# Patient Record
Sex: Male | Born: 1975 | Race: White | Hispanic: No | Marital: Married | Smoking: Never smoker
Health system: Southern US, Community
[De-identification: ages and names within clinical notes are randomized; demographics above are authoritative.]

## PROBLEM LIST (undated history)

## (undated) DIAGNOSIS — I10 Essential (primary) hypertension: Secondary | ICD-10-CM

## (undated) DIAGNOSIS — R112 Nausea with vomiting, unspecified: Secondary | ICD-10-CM

## (undated) DIAGNOSIS — S43439A Superior glenoid labrum lesion of unspecified shoulder, initial encounter: Secondary | ICD-10-CM

## (undated) DIAGNOSIS — S43432A Superior glenoid labrum lesion of left shoulder, initial encounter: Secondary | ICD-10-CM

## (undated) DIAGNOSIS — N189 Chronic kidney disease, unspecified: Secondary | ICD-10-CM

## (undated) DIAGNOSIS — Z9889 Other specified postprocedural states: Secondary | ICD-10-CM

## (undated) DIAGNOSIS — S43431A Superior glenoid labrum lesion of right shoulder, initial encounter: Secondary | ICD-10-CM

## (undated) HISTORY — PX: OTHER SURGICAL HISTORY: SHX169

## (undated) HISTORY — PX: SHOULDER SURGERY: SHX246

## (undated) HISTORY — PX: EYE SURGERY: SHX253

## (undated) HISTORY — PX: WISDOM TOOTH EXTRACTION: SHX21

## (undated) HISTORY — PX: VASECTOMY: SHX75

## (undated) HISTORY — PX: KNEE SURGERY: SHX244

---
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
## 2006-07-06 ENCOUNTER — Ambulatory Visit: Payer: Self-pay | Admitting: Sports Medicine

## 2008-10-19 ENCOUNTER — Encounter (INDEPENDENT_AMBULATORY_CARE_PROVIDER_SITE_OTHER): Payer: Self-pay | Admitting: Family Medicine

## 2008-10-19 DIAGNOSIS — J020 Streptococcal pharyngitis: Secondary | ICD-10-CM | POA: Insufficient documentation

## 2009-04-28 ENCOUNTER — Encounter: Admission: RE | Admit: 2009-04-28 | Discharge: 2009-04-28 | Payer: Self-pay | Admitting: Family Medicine

## 2009-05-26 ENCOUNTER — Ambulatory Visit: Payer: Self-pay | Admitting: Sports Medicine

## 2009-05-26 DIAGNOSIS — M67919 Unspecified disorder of synovium and tendon, unspecified shoulder: Secondary | ICD-10-CM | POA: Insufficient documentation

## 2009-05-26 DIAGNOSIS — M719 Bursopathy, unspecified: Secondary | ICD-10-CM

## 2010-06-07 NOTE — Assessment & Plan Note (Signed)
Summary: 4PM APPT,L SHOULDER PAIN X 2 WKS,MC   Vital Signs:  Patient profile:   35 year old male Height:      75 inches Weight:      200 pounds BMI:     25.09 BP sitting:   111 / 73  Vitals Entered By: Lillia Pauls CMA (May 26, 2009 4:13 PM)  History of Present Illness: - anterolateral left shoulder pain of insidious onset in 04/2009. - Carries baby with left arm in adduction and internal rotation with elbow in flexion. - Baby now weighs  ~25 lbs. - pain worst after carrying baby and on overhead motions.  - pain relieved by rest and position change. - No neck pain. No numbness/tingling/weakness. - No previous shoulder trauma or procedures. - question of left shoulder subluxation  ~10 yrs ago. Few recurrent episodes in the interim during sports activities and while sleeping in "funny" position behind head.  - Not taking any medications for shoulder pain.  Allergies (verified): No Known Drug Allergies PMH-FH-SH reviewed for relevance  Physical Exam  General:  no anterior shoulder instability  Msk:  NECK: FROM w/o pain.  SHOULDERS: Full ROM/strength. Mildly (+) NEERS toward end flexion. (-) ttp/Hawkins/Empty can. Normal labral stability.  ELBOWS/WRISTS/HANDS: Full ROM/strength. Pulses:  2+ radial pulses. Neurologic:  (-) spurling's bilaterally. 2+ DTR throughout the arms. Additional Exam:  Musculoskeletal Ultrasound: Longitudinal and transverse views of the left shoulder revealed the following:  Biceps Tendon: Normal. AC Joint: Normal. Subscapularis: (+) impingement wrt coracoid. Increased bursal fluid. Supraspinatus: (-) impingement. Increased bursal fluid. Infraspinatus: Normal. Teres Minor: Normal. Deltoid: Normal. Humeral Head: Normal. Labrum: Normal    Impression & Recommendations:  Problem # 1:  UNSPEC DISORDERS BURSAE&TENDONS SHOULDER REGION (ICD-726.10) RC Bursitis  - Daily rotator cuff and scapular stabilization exercises, per provided  patient handout, using blue band as tolerated. - OTC naprosyn 440 mg by mouth two times a day with food for 7 - 10 days. - Avoid prolonged carrying of baby in left arm as baby has naturally gained weight. - RTC as needed for any concerns.  Orders: US EXTREMITY NON-VASC REAL-TIME IMG [phone])

## 2011-01-03 ENCOUNTER — Other Ambulatory Visit: Payer: Self-pay | Admitting: Family Medicine

## 2011-01-03 DIAGNOSIS — R222 Localized swelling, mass and lump, trunk: Secondary | ICD-10-CM

## 2011-01-06 ENCOUNTER — Ambulatory Visit
Admission: RE | Admit: 2011-01-06 | Discharge: 2011-01-06 | Disposition: A | Discharge status: Home / Self Care | Source: Ambulatory Visit | Attending: Family Medicine | Admitting: Family Medicine

## 2011-01-06 DIAGNOSIS — R222 Localized swelling, mass and lump, trunk: Secondary | ICD-10-CM

## 2011-01-06 MED ORDER — IOHEXOL 300 MG/ML  SOLN
75.0000 mL | Freq: Once | INTRAMUSCULAR | Status: AC | PRN
  Administered 2011-01-06: 75 mL via INTRAVENOUS

## 2012-05-08 HISTORY — PX: SHOULDER SURGERY: SHX246

## 2013-01-23 ENCOUNTER — Other Ambulatory Visit: Payer: Self-pay | Admitting: Orthopedic Surgery

## 2013-01-23 DIAGNOSIS — M25511 Pain in right shoulder: Secondary | ICD-10-CM

## 2013-02-03 ENCOUNTER — Ambulatory Visit
Admission: RE | Admit: 2013-02-03 | Discharge: 2013-02-03 | Disposition: A | Discharge status: Home / Self Care | Source: Ambulatory Visit | Attending: Orthopedic Surgery | Admitting: Orthopedic Surgery

## 2013-02-03 DIAGNOSIS — M25511 Pain in right shoulder: Secondary | ICD-10-CM

## 2013-02-03 MED ORDER — IOHEXOL 180 MG/ML  SOLN
15.0000 mL | Freq: Once | INTRAMUSCULAR | Status: AC | PRN
  Administered 2013-02-03: 12 mL via INTRA_ARTICULAR

## 2013-02-11 ENCOUNTER — Encounter: Payer: Self-pay | Admitting: Sports Medicine

## 2013-02-11 ENCOUNTER — Ambulatory Visit (INDEPENDENT_AMBULATORY_CARE_PROVIDER_SITE_OTHER): Admitting: Sports Medicine

## 2013-02-11 VITALS — BP 137/86 | HR 53 | Ht 75.0 in | Wt 210.0 lb

## 2013-02-11 DIAGNOSIS — S43429A Sprain of unspecified rotator cuff capsule, initial encounter: Secondary | ICD-10-CM | POA: Insufficient documentation

## 2013-02-11 DIAGNOSIS — S43421A Sprain of right rotator cuff capsule, initial encounter: Secondary | ICD-10-CM

## 2013-02-11 DIAGNOSIS — M722 Plantar fascial fibromatosis: Secondary | ICD-10-CM | POA: Insufficient documentation

## 2013-02-11 MED ORDER — NITROGLYCERIN 0.2 MG/HR TD PT24: MEDICATED_PATCH | TRANSDERMAL | Status: DC

## 2013-02-11 NOTE — Progress Notes (Signed)
Patient ID: Elijah Johnson, male   DOB: 09-17-1975, 37 y.o.   MRN: [id number]  Chief Complaint  Patient presents with  . Foot Pain   HPI Elijah Johnson is a 37 y.o. Male who presents to the sports medicine clinic for evaluation of right heel pain and right shoulder pain.   - right heel: started in March after he started running and exercising after period of rest. Pain is located on the medial aspect of the bottom of his right foot close to the heel. Pain is worst with prolonged standing. Worst with initial standing in the morning. Pain is a burning, aching sensation. He used superfeet inserts in July which mildly helped. He also had a cortisone injection 6 weeks ago which helped.  He went hiking in [place] this weekend and had worst pain on the first day of hiking but improved on the second.   - right shoulder: he has a history of posterior labral repair of the left shoulder but has not had trouble with the right.  6 months ago, he threw a ball in a downward motion which instantly caused sudden pain along the shoulder. Pain has become worst with throwing or pushing overhead. No weakness. No numbness, no tingling  Medical and surgical history significant for left labral tear repair Note this also presented as a subscap type RC injury  Family History: non contributory  Social History History  Substance Use Topics  . Smoking status: Never Smoker   . Smokeless tobacco: Never Used  . Alcohol Use: Not on file    No Known Allergies  Current Outpatient Prescriptions  Medication Sig Dispense Refill  . meloxicam (MOBIC) 15 MG tablet Take 15 mg by mouth daily as needed for pain.      . nitroGLYCERIN (NITRODUR - DOSED IN MG/24 HR) 0.2 mg/hr patch Apply 1/4 patch to affected area daily.  Change patch every 24 hours.  30 patch  1   No current facility-administered medications for this visit.    Review of Systems Review of Systems Negative except per HPI Blood pressure 137/86, pulse 53,  height 6\' 3"  (1.905 m), weight 210 lb (95.255 kg).  Physical Exam Physical Exam Filed Vitals:   02/11/13 1334  Height: 6\' 3"  (1.905 m)  Weight: 210 lb (95.255 kg)   Foot: right: high arch but right navicular drop test positive. Dropped transverse arch Morton's toe, splaying between the 2nd and third toes  Callus along 3rd and 4th metatarsal pads.    Unstable ankle compared to left Tenderness to palpation along medial plantar calcaneal region on right  Right Shoulder:  Normal range of motion above head with painful drop arc around 100degrees No soft tissue swelling No tenderness along shoulder  Normal external and internal rotation bilaterally Normal empty can bilaterally Non conclusive O'Brien's Pain and weakness with subscapularis lift off test on right compared to left Pain also with abduction to 90deg and resisted IR Normal Hawkin's   Musculoskeletal Ultrasound: Right foot:  Thickened plantar fascia at 0.85cm    Right Shoulder:  Small hypoechoic area at level of subscapularis.  This seems to gap open with motion on [place]. Infraspinatus, supraspinatus and teres minor intact BT looks normal  Assessment    37 yo male who presents with right foot pain and right shoulder pain.  Right foot pain from plantar fasciitis likely prompted by sudden activity level after more prolonged period of rest.  Right shoulder pain from subscapularis tear based on ultrasound and physical exam  findings.     Plan    1. Plantar fasciitis of right foot:  - stretching exercises, plantar fasciitis exercises as well as cone touch balance exercise for ankle stability, given increased laxity in right ankle compared to left  2. Right shoulder pain: - internal rotation exercises with 5lb dumbbells.  - nitroglycerin protocol - follow up in 6 weeks     Marena Chancy 02/11/2013, 2:43 PM Family Medicine Resident, PGY-3

## 2013-02-11 NOTE — Patient Instructions (Addendum)
For the shoulder: it looks like a small tear subscapularis tendon.  Do the strengthening exercises with 5lb dumbmells, doing internal rotation at different levels.   For the plantar fascitis, do the sets of exercises and stretches.    Nitroglycerin Protocol   Apply 1/4 nitroglycerin patch to affected area daily.  Change position of patch within the affected area every 24 hours.  You may experience a headache during the first 1-2 weeks of using the patch, these should subside.  If you experience headaches after beginning nitroglycerin patch treatment, you may take your preferred over the counter pain reliever.  Another side effect of the nitroglycerin patch is skin irritation or rash related to patch adhesive.  Please notify our office if you develop more severe headaches or rash, and stop the patch.  Tendon healing with nitroglycerin patch may require 12 to 24 weeks depending on the extent of injury.  Men should not use if taking Viagra, Cialis, or Levitra.   Do not use if you have migraines or rosacea.   Please follow up in 6 weeks.

## 2013-02-11 NOTE — Assessment & Plan Note (Signed)
Stretching protocol  Strength protocol for PF and for ankle stability  Arch strap  Use arch support  Reck 8 wks

## 2013-02-11 NOTE — Assessment & Plan Note (Addendum)
Cannot exclude labral injury w equivocal test  Clearly some injury to RC at subscap tendon Seems likely small tear on [place] Labral injury eval if not resolving  Trial NTG protocol  Reck 6 wks

## 2013-03-25 ENCOUNTER — Ambulatory Visit: Admitting: Sports Medicine

## 2013-04-01 ENCOUNTER — Ambulatory Visit (INDEPENDENT_AMBULATORY_CARE_PROVIDER_SITE_OTHER): Admitting: Sports Medicine

## 2013-04-01 VITALS — BP 122/88 | HR 53 | Ht 75.0 in | Wt 210.0 lb

## 2013-04-01 DIAGNOSIS — S43421A Sprain of right rotator cuff capsule, initial encounter: Secondary | ICD-10-CM

## 2013-04-01 DIAGNOSIS — S43429A Sprain of unspecified rotator cuff capsule, initial encounter: Secondary | ICD-10-CM

## 2013-04-01 DIAGNOSIS — M722 Plantar fascial fibromatosis: Secondary | ICD-10-CM

## 2013-04-01 NOTE — Progress Notes (Signed)
Patient ID: Elijah Johnson, male   DOB: 05-08-1976, 37 y.o.   MRN: [id number]  Chief Complaint  Patient presents with  . Foot Pain   HPI Elijah Johnson is a 37 y.o. Male who presents to the sports medicine clinic for re-evaluation of right plantar fasciitis.  - Right heel: starting 6 months ago he was running and exercising after period of rest following a shoulder surgery. With running he developed pain located on the medial aspect of the right calcaneous only the plantar fascia. Pain is worst with prolonged standing. Worst with initial standing in the morning. Pain is a burning, aching sensation. He used superfeet inserts in July which mildly helped. He has been working on his stretching protocol and wearing a night splint that is helpful. He reports about 50% improvement back to baseline.   Concerning is right shoulder pain he did not start any exercises or the nitroglycerin protocol because he had an MRA two days after is appointment that showed a posterior labral tear and thus started planning for surgical repair with is orthopedic surgeon.  Medical and surgical history significant for left labral tear repair  Family History: no history of DM  Social History History  Substance Use Topics  . Smoking status: Never Smoker   . Smokeless tobacco: Never Used  . Alcohol Use: Not on file   No Known Allergies  Review of Systems Review of Systems Negative except per HPI Blood pressure 137/86, pulse 53, height 6\' 3"  (1.905 m), weight 210 lb (95.255 kg).  Physical Exam Physical Exam Filed Vitals:   02/11/13 1334  Height: 6\' 3"  (1.905 m)  Weight: 210 lb (95.255 kg)   Foot: right: high arch with right navicular drop test positive with standing. Dropped transverse arch Morton's toe, splaying between the 2nd and third toes  Unstable ankle joint with lateral joint laxity Tenderness to palpation along medial plantar calcaneal region on right with less pain.   Assessment 1. Right foot  pain from plantar fasciitis  2. Right shoulder with subscapularis tear based on ultrasound but this looks more like tendinopathy on MRI and MRI evidence of posterior labral tear   Plan 1. Plantar fasciitis of right foot:  - Continue aggressive stretching exercises with eccentric ankle exercises/ stability training, plantar fasciitis stretching, night splint, continue super feet insoles as they seem to be helpful. Recommend starting some light running and walking over the next few weeks until he has to stop for his shoulder surgery. Recommend cross training with biking during is shoulder surgery recovery period until he is cleared to start running again.  2. Right shoulder pain: plan for surgery in the next month with ortho for posterior labral tear.

## 2013-04-01 NOTE — Assessment & Plan Note (Signed)
Review of MRI does clearly show the tear in post half of labrum or RT shoulder (similar to one he had on left)  RC tendons look intact on MRI but some tendinopathy of subscap and SS Start RC rehab after his labral repair  Note we discussed trying to work with Amado Coe, PT as this would be convenient for him

## 2013-04-01 NOTE — Assessment & Plan Note (Signed)
Good improvement primarily with stretching protocol Arch strap Super feet  Cont these  Add more of heel raises  Reck if not better in 2 to 3 mos

## 2013-04-16 ENCOUNTER — Encounter (HOSPITAL_BASED_OUTPATIENT_CLINIC_OR_DEPARTMENT_OTHER): Payer: Self-pay | Admitting: *Deleted

## 2013-04-25 ENCOUNTER — Ambulatory Visit (HOSPITAL_BASED_OUTPATIENT_CLINIC_OR_DEPARTMENT_OTHER)
Admission: RE | Admit: 2013-04-25 | Discharge: 2013-04-25 | Disposition: A | Discharge status: Home / Self Care | Source: Ambulatory Visit | Attending: Orthopedic Surgery | Admitting: Orthopedic Surgery

## 2013-04-25 ENCOUNTER — Ambulatory Visit (HOSPITAL_BASED_OUTPATIENT_CLINIC_OR_DEPARTMENT_OTHER): Admitting: Anesthesiology

## 2013-04-25 ENCOUNTER — Encounter (HOSPITAL_BASED_OUTPATIENT_CLINIC_OR_DEPARTMENT_OTHER)
Admission: RE | Disposition: A | Discharge status: Home / Self Care | Payer: Self-pay | Source: Ambulatory Visit | Attending: Orthopedic Surgery

## 2013-04-25 ENCOUNTER — Encounter (HOSPITAL_BASED_OUTPATIENT_CLINIC_OR_DEPARTMENT_OTHER): Admitting: Anesthesiology

## 2013-04-25 ENCOUNTER — Encounter (HOSPITAL_BASED_OUTPATIENT_CLINIC_OR_DEPARTMENT_OTHER): Payer: Self-pay | Admitting: *Deleted

## 2013-04-25 DIAGNOSIS — M24819 Other specific joint derangements of unspecified shoulder, not elsewhere classified: Secondary | ICD-10-CM | POA: Insufficient documentation

## 2013-04-25 DIAGNOSIS — S43432A Superior glenoid labrum lesion of left shoulder, initial encounter: Secondary | ICD-10-CM

## 2013-04-25 DIAGNOSIS — S43431A Superior glenoid labrum lesion of right shoulder, initial encounter: Secondary | ICD-10-CM

## 2013-04-25 HISTORY — DX: Superior glenoid labrum lesion of left shoulder, initial encounter: S43.432A

## 2013-04-25 HISTORY — DX: Superior glenoid labrum lesion of right shoulder, initial encounter: S43.431A

## 2013-04-25 HISTORY — DX: Superior glenoid labrum lesion of unspecified shoulder, initial encounter: S43.439A

## 2013-04-25 SURGERY — SHOULDER ATHROSCOPY WITH CAPSULORRHAPHY
Anesthesia: General | Site: Shoulder | Laterality: Right

## 2013-04-25 MED ORDER — SENNA-DOCUSATE SODIUM 8.6-50 MG PO TABS: 2.0000 | ORAL_TABLET | Freq: Every day | ORAL | Status: DC

## 2013-04-25 MED ORDER — CEFAZOLIN SODIUM-DEXTROSE 2-3 GM-% IV SOLR
2.0000 g | INTRAVENOUS | Status: AC
  Administered 2013-04-25: 2 g via INTRAVENOUS

## 2013-04-25 MED ORDER — PROMETHAZINE HCL 25 MG PO TABS: 25.0000 mg | ORAL_TABLET | Freq: Four times a day (QID) | ORAL | Status: DC | PRN

## 2013-04-25 MED ORDER — SODIUM CHLORIDE 0.9 % IV SOLN
INTRAVENOUS | Status: DC | PRN
  Administered 2013-04-25: 1000 mL

## 2013-04-25 MED ORDER — SUCCINYLCHOLINE CHLORIDE 20 MG/ML IJ SOLN
INTRAMUSCULAR | Status: DC | PRN
  Administered 2013-04-25: 100 mg via INTRAVENOUS

## 2013-04-25 MED ORDER — SODIUM CHLORIDE 0.9 % IR SOLN
Status: DC | PRN
  Administered 2013-04-25: 3000 mL

## 2013-04-25 MED ORDER — METHOCARBAMOL 500 MG PO TABS: 500.0000 mg | ORAL_TABLET | Freq: Four times a day (QID) | ORAL | Status: AC

## 2013-04-25 MED ORDER — MIDAZOLAM HCL 2 MG/2ML IJ SOLN
1.0000 mg | INTRAMUSCULAR | Status: DC | PRN
  Administered 2013-04-25: 2 mg via INTRAVENOUS

## 2013-04-25 MED ORDER — FENTANYL CITRATE 0.05 MG/ML IJ SOLN
INTRAMUSCULAR | Status: DC | PRN
  Administered 2013-04-25: 50 ug via INTRAVENOUS

## 2013-04-25 MED ORDER — FENTANYL CITRATE 0.05 MG/ML IJ SOLN
INTRAMUSCULAR | Status: AC
  Filled 2013-04-25: qty 2

## 2013-04-25 MED ORDER — FENTANYL CITRATE 0.05 MG/ML IJ SOLN
50.0000 ug | INTRAMUSCULAR | Status: DC | PRN
  Administered 2013-04-25: 100 ug via INTRAVENOUS

## 2013-04-25 MED ORDER — LIDOCAINE HCL (CARDIAC) 20 MG/ML IV SOLN
INTRAVENOUS | Status: DC | PRN
  Administered 2013-04-25: 50 mg via INTRAVENOUS

## 2013-04-25 MED ORDER — PROPOFOL 10 MG/ML IV BOLUS
INTRAVENOUS | Status: DC | PRN
  Administered 2013-04-25: 200 mg via INTRAVENOUS

## 2013-04-25 MED ORDER — OXYCODONE HCL 5 MG PO TABS: 5.0000 mg | ORAL_TABLET | Freq: Once | ORAL | Status: DC | PRN

## 2013-04-25 MED ORDER — PROMETHAZINE HCL 25 MG/ML IJ SOLN: 6.2500 mg | INTRAMUSCULAR | Status: DC | PRN

## 2013-04-25 MED ORDER — ONDANSETRON HCL 4 MG/2ML IJ SOLN
INTRAMUSCULAR | Status: DC | PRN
  Administered 2013-04-25: 4 mg via INTRAVENOUS

## 2013-04-25 MED ORDER — OXYCODONE HCL 5 MG/5ML PO SOLN: 5.0000 mg | Freq: Once | ORAL | Status: DC | PRN

## 2013-04-25 MED ORDER — DEXAMETHASONE SODIUM PHOSPHATE 4 MG/ML IJ SOLN
INTRAMUSCULAR | Status: DC | PRN
  Administered 2013-04-25: 10 mg via INTRAVENOUS

## 2013-04-25 MED ORDER — CEFAZOLIN SODIUM-DEXTROSE 2-3 GM-% IV SOLR
INTRAVENOUS | Status: AC
  Filled 2013-04-25: qty 50

## 2013-04-25 MED ORDER — HYDROMORPHONE HCL PF 1 MG/ML IJ SOLN: 0.2500 mg | INTRAMUSCULAR | Status: DC | PRN

## 2013-04-25 MED ORDER — OXYCODONE-ACETAMINOPHEN 5-325 MG PO TABS: 1.0000 | ORAL_TABLET | Freq: Four times a day (QID) | ORAL | Status: DC | PRN

## 2013-04-25 MED ORDER — MIDAZOLAM HCL 2 MG/2ML IJ SOLN
INTRAMUSCULAR | Status: AC
  Filled 2013-04-25: qty 2

## 2013-04-25 MED ORDER — LACTATED RINGERS IV SOLN
INTRAVENOUS | Status: DC
  Administered 2013-04-25: 07:00:00 via INTRAVENOUS

## 2013-04-25 SURGICAL SUPPLY — 71 items
ANCH SUT SHRT 12.5 CANN EYLT (Anchor) ×1 IMPLANT
ANCHOR SUT BIOCOMP LK 2.9X12.5 (Anchor) ×1 IMPLANT
APL SKNCLS STERI-STRIP NONHPOA (GAUZE/BANDAGES/DRESSINGS) ×1
BENZOIN TINCTURE PRP APPL 2/3 (GAUZE/BANDAGES/DRESSINGS) ×2 IMPLANT
BLADE CUTTER GATOR 3.5 (BLADE) ×2 IMPLANT
BLADE GREAT WHITE 4.2 (BLADE) IMPLANT
BLADE SURG 15 STRL LF DISP TIS (BLADE) IMPLANT
BLADE SURG 15 STRL SS (BLADE)
BUR OVAL 4.0 (BURR) IMPLANT
BUR OVAL 6.0 (BURR) IMPLANT
CANISTER SUCT LVC 12 LTR MEDI- (MISCELLANEOUS) ×2 IMPLANT
CANNULA 5.75X71 LONG (CANNULA) ×3 IMPLANT
CANNULA TWIST IN 8.25X7CM (CANNULA) IMPLANT
CANNULA TWIST IN 8.25X9CM (CANNULA) IMPLANT
DECANTER SPIKE VIAL GLASS SM (MISCELLANEOUS) IMPLANT
DRAPE INCISE IOBAN 66X45 STRL (DRAPES) ×2 IMPLANT
DRAPE SHOULDER BEACH CHAIR (DRAPES) ×2 IMPLANT
DRAPE U 20/CS (DRAPES) ×2 IMPLANT
DRAPE U-SHAPE 47X51 STRL (DRAPES) ×2 IMPLANT
DURAPREP 26ML APPLICATOR (WOUND CARE) ×2 IMPLANT
ELECT REM PT RETURN 9FT ADLT (ELECTROSURGICAL)
ELECTRODE REM PT RTRN 9FT ADLT (ELECTROSURGICAL) IMPLANT
FIBERSTICK 2 (SUTURE) IMPLANT
GLOVE BIO SURGEON STRL SZ8 (GLOVE) ×2 IMPLANT
GLOVE BIOGEL PI IND STRL 7.0 (GLOVE) IMPLANT
GLOVE BIOGEL PI IND STRL 8 (GLOVE) ×2 IMPLANT
GLOVE BIOGEL PI INDICATOR 7.0 (GLOVE) ×2
GLOVE BIOGEL PI INDICATOR 8 (GLOVE) ×2
GLOVE ECLIPSE 6.5 STRL STRAW (GLOVE) ×1 IMPLANT
GLOVE ORTHO TXT STRL SZ7.5 (GLOVE) ×2 IMPLANT
GOWN BRE IMP PREV XXLGXLNG (GOWN DISPOSABLE) ×4 IMPLANT
IMMOBILIZER SHOULDER XLGE (ORTHOPEDIC SUPPLIES) ×1 IMPLANT
IV NS IRRIG 3000ML ARTHROMATIC (IV SOLUTION) ×4 IMPLANT
KIT PUSHLOCK 2.9 HIP (KITS) ×1 IMPLANT
KIT SHOULDER TRACTION (DRAPES) ×2 IMPLANT
LASSO CRESCENT QUICKPASS (SUTURE) ×2 IMPLANT
LASSO SUT 90 DEGREE (SUTURE) IMPLANT
NDL SCORPION MULTI FIRE (NEEDLE) IMPLANT
NEEDLE SCORPION MULTI FIRE (NEEDLE) IMPLANT
PACK ARTHROSCOPY DSU (CUSTOM PROCEDURE TRAY) ×2 IMPLANT
PACK BASIN DAY SURGERY FS (CUSTOM PROCEDURE TRAY) ×2 IMPLANT
PAD ABD 8X10 STRL (GAUZE/BANDAGES/DRESSINGS) ×2 IMPLANT
SET ARTHROSCOPY TUBING (MISCELLANEOUS) ×2
SET ARTHROSCOPY TUBING LN (MISCELLANEOUS) ×1 IMPLANT
SHEET MEDIUM DRAPE 40X70 STRL (DRAPES) ×2 IMPLANT
SLEEVE SCD COMPRESS KNEE MED (MISCELLANEOUS) ×2 IMPLANT
SLING ARM FOAM STRAP LRG (SOFTGOODS) IMPLANT
SLING ARM FOAM STRAP MED (SOFTGOODS) IMPLANT
SLING ARM FOAM STRAP XLG (SOFTGOODS) IMPLANT
SLING ARM IMMOBILIZER LRG (SOFTGOODS) IMPLANT
SLING ARM IMMOBILIZER MED (SOFTGOODS) IMPLANT
SPONGE GAUZE 4X4 12PLY (GAUZE/BANDAGES/DRESSINGS) ×2 IMPLANT
STRIP CLOSURE SKIN 1/2X4 (GAUZE/BANDAGES/DRESSINGS) ×2 IMPLANT
SUT FIBERWIRE #2 38 T-5 BLUE (SUTURE)
SUT LASSO 45 DEGREE (SUTURE) IMPLANT
SUT LASSO 45 DEGREE LEFT (SUTURE) IMPLANT
SUT LASSO 45D RIGHT (SUTURE) IMPLANT
SUT MNCRL AB 4-0 PS2 18 (SUTURE) ×1 IMPLANT
SUT PDS AB 1 CT  36 (SUTURE)
SUT PDS AB 1 CT 36 (SUTURE) IMPLANT
SUT TIGER TAPE 7 IN WHITE (SUTURE) IMPLANT
SUT VIC AB 3-0 SH 27 (SUTURE)
SUT VIC AB 3-0 SH 27X BRD (SUTURE) IMPLANT
SUTURE FIBERWR #2 38 T-5 BLUE (SUTURE) IMPLANT
TAPE FIBER 2MM 7IN #2 BLUE (SUTURE) IMPLANT
TAPE SUT LABRALTAP WHT/BLK (SUTURE) ×1 IMPLANT
TOWEL OR 17X24 6PK STRL BLUE (TOWEL DISPOSABLE) ×2 IMPLANT
TOWEL OR NON WOVEN STRL DISP B (DISPOSABLE) ×3 IMPLANT
TUBE CONNECTING 20X1/4 (TUBING) IMPLANT
WAND STAR VAC 90 (SURGICAL WAND) IMPLANT
WATER STERILE IRR 1000ML POUR (IV SOLUTION) ×2 IMPLANT

## 2013-04-25 NOTE — H&P (Signed)
  PREOPERATIVE H&P  Chief Complaint: RIGHT SHOULDER INSTABILITY  HPI: Elijah Johnson is a 37 y.o. male who presents for preoperative history and physical with a diagnosis of RIGHT SHOULDER INSTABILITY. Symptoms are rated as moderate to severe, and have been worsening.  This is significantly impairing activities of daily living.  He has elected for surgical management. He is a previous left shoulder posterior Bankart repair, which is done well, he continues to have symptoms with activities, as well as sports, with the right shoulder. He has failed injections, physical therapy, among others.  Past Medical History  Diagnosis Date  . Labral tear of shoulder     right   Past Surgical History  Procedure Laterality Date  . Shoulder surgery Right 2014    dr Dion Saucier   History   Social History  . Marital Status: Married    Spouse Name: N/A    Number of Children: N/A  . Years of Education: N/A   Social History Main Topics  . Smoking status: Never Smoker   . Smokeless tobacco: Never Used  . Alcohol Use: Yes     Comment: social  . Drug Use: No  . Sexual Activity: None   Other Topics Concern  . None   Social History Narrative  . None   History reviewed. No pertinent family history. No Known Allergies Prior to Admission medications   Not on File     Positive ROS: All other systems have been reviewed and were otherwise negative with the exception of those mentioned in the HPI and as above.  Physical Exam: General: Alert, no acute distress Cardiovascular: No pedal edema Respiratory: No cyanosis, no use of accessory musculature GI: No organomegaly, abdomen is soft and non-tender Skin: No lesions in the area of chief complaint Neurologic: Sensation intact distally Psychiatric: Patient is competent for consent with normal mood and affect Lymphatic: No axillary or cervical lymphadenopathy  MUSCULOSKELETAL: Right shoulder has positive posterior load and shift test. Rotator cuff  strength is intact.  Assessment: RIGHT SHOULDER POSTERIOR INSTABILITY with labral tear  Plan: Plan for Procedure(s): RIGHT SHOULDER ATHROSCOPY WITH CAPSULORRHAPHY and posterior labral repair  The risks benefits and alternatives were discussed with the patient including but not limited to the risks of nonoperative treatment, versus surgical intervention including infection, bleeding, nerve injury,  blood clots, cardiopulmonary complications, morbidity, mortality, among others, and they were willing to proceed.   Eulas Post, MD Cell [phone]   04/25/2013 7:19 AM

## 2013-04-25 NOTE — Anesthesia Procedure Notes (Addendum)
Anesthesia Regional Block:  Interscalene brachial plexus block  Pre-Anesthetic Checklist: ,, timeout performed, Correct Patient, Correct Site, Correct Laterality, Correct Procedure, Correct Position, site marked, Risks and benefits discussed,  Surgical consent,  Pre-op evaluation,  At surgeon's request and post-op pain management  Laterality: Right  Prep: chloraprep       Needles:  Injection technique: Single-shot  Needle Type: Echogenic Stimulator Needle     Needle Length: 5cm 5 cm Needle Gauge: 22 and 22 G    Additional Needles:  Procedures: ultrasound guided (picture in chart) and nerve stimulator Interscalene brachial plexus block  Nerve Stimulator or Paresthesia:  Response: bicep contraction, 0.45 mA,   Additional Responses:   Narrative:  Start time: 04/25/2013 7:03 AM End time: 04/25/2013 7:13 AM Injection made incrementally with aspirations every 5 mL.  Performed by: Personally  Anesthesiologist: J. Adonis Huguenin, MD  Additional Notes: Functioning IV was confirmed and monitors applied.  A 50mm 22ga echogenic arrow stimulator was used. Sterile prep and drape,hand hygiene and sterile gloves were used.Ultrasound guidance: relevant anatomy identified, needle position confirmed, local anesthetic spread visualized around nerve(s)., vascular puncture avoided.  Image printed for medical record.  Negative aspiration and negative test dose prior to incremental administration of local anesthetic. The patient tolerated the procedure well.   Procedure Name: Intubation Performed by: York Grice Pre-anesthesia Checklist: Patient identified, Timeout performed, Emergency Drugs available, Suction available and Patient being monitored Patient Re-evaluated:Patient Re-evaluated prior to inductionOxygen Delivery Method: Circle system utilized Preoxygenation: Pre-oxygenation with 100% oxygen Intubation Type: IV induction Ventilation: Mask ventilation without difficulty Laryngoscope  Size: Miller and 2 Grade View: Grade I Tube type: Oral Tube size: 7.0 mm Number of attempts: 1 Airway Equipment and Method: Stylet Placement Confirmation: breath sounds checked- equal and bilateral and positive ETCO2 Secured at: 22 cm Tube secured with: Tape Dental Injury: Teeth and Oropharynx as per pre-operative assessment

## 2013-04-25 NOTE — Progress Notes (Signed)
Assisted Dr. Singer with right, ultrasound guided, interscalene  block. Side rails up, monitors on throughout procedure. See vital signs in flow sheet. Tolerated Procedure well. 

## 2013-04-25 NOTE — Transfer of Care (Signed)
Immediate Anesthesia Transfer of Care Note  Patient: Elijah Johnson  Procedure(s) Performed: Procedure(s): RIGHT SHOULDER ATHROSCOPY WITH POSTERIOR CAPSULORRHAPHY (Right)  Patient Location: PACU  Anesthesia Type:General  Level of Consciousness: awake and sedated  Airway & Oxygen Therapy: Patient Spontanous Breathing and Patient connected to face mask oxygen  Post-op Assessment: Report given to PACU RN and Post -op Vital signs reviewed and stable  Post vital signs: Reviewed and stable  Complications: No apparent anesthesia complications

## 2013-04-25 NOTE — Op Note (Signed)
04/25/2013  8:40 AM  PATIENT:  Phill Mutter    PRE-OPERATIVE DIAGNOSIS:  Right shoulder posterior labral tear  POST-OPERATIVE DIAGNOSIS:  Same  PROCEDURE:  Right SHOULDER ATHROSCOPY WITH POSTERIOR CAPSULORRHAPHY and posterior Bankart repair  SURGEON:  Eulas Post, MD  PHYSICIAN ASSISTANT: Janace Litten, OPA-C, present and scrubbed throughout the case, critical for completion in a timely fashion, and for retraction, instrumentation, and closure.  ANESTHESIA:   General  PREOPERATIVE INDICATIONS:  YAO HYPPOLITE is a  37 y.o. male with a diagnosis of RIGHT SHOULDER POSTERIOR LABRAL TEAR who failed conservative measures and elected for surgical management.  He has had a previous left shoulder posterior labral tear that was repaired and did very well, in which the same thing done to his right side.  The risks benefits and alternatives were discussed with the patient preoperatively including but not limited to the risks of infection, bleeding, nerve injury, cardiopulmonary complications, the need for revision surgery, among others, and the patient was willing to proceed. We'll see discussed the risks for stiffness, posttraumatic arthritis, incomplete relief of symptoms, recurrent labral tear, among others.  OPERATIVE IMPLANTS: Arthrex bio composite 2.9 mm x 12.5 mm push lock anchors x1 with an inverted labral tape  OPERATIVE FINDINGS: The shoulder was stable anteriorly to testing under anesthesia, although posteriorly had a click. It did not dislocate however posteriorly. The glenohumeral articular surfaces were essentially normal, although there was a small reverse Hill-Sachs type lesion, almost like an old scar on the humeral head anteriorly. The cartilage was still covering the head, and this small lesion, measuring about 3 x 5 mm, but it did appear as if it was an old injury like a healed microfracture.  The biceps tendon and rotator cuff supraspinatus and infraspinatus and  subscapularis and biceps pulley were all completely normal. The anterior labrum had a little bit of cracking, but no instability. The posterior labrum had extensive fraying and tearing that was extending from the 11:00 position down to at least the 6:00 position.  OPERATIVE PROCEDURE: The patient was brought to the operating room and placed in the supine position. General anesthesia was administered. IV antibiotics were given. He was placed in a semilateral decubitus position all bony prominences were padded. Time out was performed. The left upper extremity was prepped and draped in usual sterile fashion, and diagnostic arthroscopy was carried out with the above-named findings. I switched to the anterior portal, and debrided the posterior labrum with the shaver from the posterior portal. I placed a second posterior portal, and used the elevator spatula to mobilize the labrum inferiorly. The labrum was torn, and was relatively easy to mobilize off of the lateral most aspect of the glenoid surface/neck. The deeper it was still well attached, so I did not take this completely down. Once I had adequate mobility, I prepared the neck/lateral rim of the glenoid with a shaver, and then used a crescent suture passer to pass the inferior limb of the fiber tape.  I then passed the superior limb using a similar technique, and had excellent purchase on the capsular tissue.  I then placed a anchor from my high lateral portal, with appropriate entry angle to the face of the glenoid, and this was slightly below the level of the bare spot. This provided excellent imbrication of the tissue, and re\re apposition of the glenoid labrum back to the face of the glenoid.  The labral tape was then cut, final pictures taken, and the instruments removed. The portals  were closed with Monocryl followed by Steri-Strips and sterile gauze. He was awakened and returned to the PACU in stable and satisfactory condition. There no complications  and he tolerated the procedure well.

## 2013-04-25 NOTE — Anesthesia Postprocedure Evaluation (Signed)
Anesthesia Post Note  Patient: Elijah Johnson  Procedure(s) Performed: Procedure(s) (LRB): RIGHT SHOULDER ATHROSCOPY WITH POSTERIOR CAPSULORRHAPHY (Right)  Anesthesia type: general  Patient location: PACU  Post pain: Pain level controlled  Post assessment: Patient's Cardiovascular Status Stable  Last Vitals:  Filed Vitals:   04/25/13 1000  BP: 129/80  Pulse: 60  Temp:   Resp: 15    Post vital signs: Reviewed and stable  Level of consciousness: sedated  Complications: No apparent anesthesia complications

## 2013-04-25 NOTE — Anesthesia Preprocedure Evaluation (Signed)
Anesthesia Evaluation  Patient identified by MRN, date of birth, ID band Patient awake    Reviewed: Allergy & Precautions, H&P , NPO status , Patient's Chart, lab work & pertinent test results  Airway Mallampati: II TM Distance: >3 FB Neck ROM: full    Dental  (+) Teeth Intact and Dental Advidsory Given   Pulmonary neg pulmonary ROS,  breath sounds clear to auscultation  Pulmonary exam normal       Cardiovascular negative cardio ROS  Rhythm:regular Rate:Normal     Neuro/Psych negative neurological ROS  negative psych ROS   GI/Hepatic negative GI ROS, Neg liver ROS,   Endo/Other  negative endocrine ROS  Renal/GU negative Renal ROS     Musculoskeletal   Abdominal Normal abdominal exam  (+)   Peds  Hematology   Anesthesia Other Findings   Reproductive/Obstetrics negative OB ROS                           Anesthesia Physical Anesthesia Plan  ASA: I  Anesthesia Plan: General ETT   Post-op Pain Management: MAC Combined w/ Regional for Post-op pain   Induction:   Airway Management Planned:   Additional Equipment:   Intra-op Plan:   Post-operative Plan:   Informed Consent: I have reviewed the patients History and Physical, chart, labs and discussed the procedure including the risks, benefits and alternatives for the proposed anesthesia with the patient or authorized representative who has indicated his/her understanding and acceptance.   Dental Advisory Given  Plan Discussed with: Anesthesiologist, CRNA and Surgeon  Anesthesia Plan Comments:         Anesthesia Quick Evaluation

## 2019-04-14 ENCOUNTER — Ambulatory Visit (INDEPENDENT_AMBULATORY_CARE_PROVIDER_SITE_OTHER): Admitting: Orthopaedic Surgery

## 2019-04-14 ENCOUNTER — Other Ambulatory Visit: Payer: Self-pay

## 2019-04-14 ENCOUNTER — Ambulatory Visit (INDEPENDENT_AMBULATORY_CARE_PROVIDER_SITE_OTHER)

## 2019-04-14 DIAGNOSIS — M25561 Pain in right knee: Secondary | ICD-10-CM

## 2019-04-14 DIAGNOSIS — G8929 Other chronic pain: Secondary | ICD-10-CM

## 2019-04-14 NOTE — Progress Notes (Signed)
Office Visit Note   Patient: Elijah Johnson           Date of Birth: 1975/06/23           MRN: [id number] Visit Date: 04/14/2019              Requested by: Gaynelle Arabian, MD [address] [place],  [place] [address] PCP: Gaynelle Arabian, MD   Assessment & Plan: Visit Diagnoses:  1. Chronic pain of right knee     Plan: At this point given the conservative treatment failure for over 6 months involving his right knee, a MRI is warranted to rule out a meniscal tear.  This is also based on his clinical exam findings and signs and symptoms.  We will see him back once we have an MRI obtained of his right knee.  All question concerns were answered and addressed.  Follow-Up Instructions: No follow-ups on file.   Orders:  Orders Placed This Encounter  Procedures  . XR Knee 1-2 Views Right   No orders of the defined types were placed in this encounter.     Procedures: No procedures performed   Clinical Data: No additional findings.   Subjective: Chief Complaint  Patient presents with  . Right Knee - Pain  Elijah Johnson is a very pleasant and active 43 year old gentleman who has been dealing with worsening right knee pain and instability for 8 to 9 months now.  He was squatting picking something up when he felt a pop in his knee back in March of this year.  This now happens almost on a daily basis.  He has had locking catching.  He feels weak with his right knee.  He says it is incredibly painful after standing all day long or squatting and performing a lot of activities.  It is along the medial joint line where he points in terms of source of his  pain.  He is never injured this knee before.  He used to be an avid runner.  He has tried and failed conservative treatment for over 6 months now including activity modification, portion of the exercises, ice, heat, and anti-inflammatory medications.  HPI  Review of Systems He currently denies any headache, chest pain, shortness of breath, fever, chills, nausea, vomiting  Objective: Vital Signs: There were no vitals taken for this visit.  Physical Exam He is alert and orient x3 and in no acute distress Ortho Exam Examination of his right knee does show a slight effusion.  I do  feel that his quads look weaker on the right side than the left side with some slight atrophy.  He has a positive McMurray sign to the medial compartment of the knee.  His range of motion is full.  His Lachman's is negative. Specialty Comments:  No specialty comments available.  Imaging: Xr Knee 1-2 Views Right  Result Date: 04/14/2019 2 views of the right knee does show medial joint space narrowing but otherwise no acute findings.  There is slight patellofemoral narrowing.    PMFS History: Patient Active Problem List   Diagnosis Date Noted  . Tear of right glenoid labrum 04/25/2013  . Plantar fasciitis of right foot 02/11/2013  . Rotator cuff (capsule) sprain 02/11/2013  . UNSPEC DISORDERS BURSAE&TENDONS SHOULDER REGION 05/26/2009  . STREPTOCOCCAL PHARYNGITIS 10/19/2008   Past Medical History:  Diagnosis Date  . Labral tear of shoulder    right  . Tear of left glenoid labrum 04/25/2013  . Tear of right glenoid labrum 04/25/2013    No family history on file.  Past Surgical History:  Procedure Laterality Date  . SHOULDER SURGERY Right 2014   dr Dion Saucier   Social History   Occupational History  . Not on file  Tobacco Use  . Smoking status: Never Smoker  . Smokeless tobacco: Never Used  Substance and Sexual Activity  . Alcohol use: Yes    Comment: social  . Drug use: No  . Sexual activity:  Not on file

## 2019-05-03 ENCOUNTER — Ambulatory Visit
Admission: RE | Admit: 2019-05-03 | Discharge: 2019-05-03 | Disposition: A | Discharge status: Home / Self Care | Source: Ambulatory Visit | Attending: Orthopaedic Surgery | Admitting: Orthopaedic Surgery

## 2019-05-03 ENCOUNTER — Other Ambulatory Visit: Payer: Self-pay

## 2019-05-03 DIAGNOSIS — G8929 Other chronic pain: Secondary | ICD-10-CM

## 2019-05-06 ENCOUNTER — Ambulatory Visit (INDEPENDENT_AMBULATORY_CARE_PROVIDER_SITE_OTHER): Admitting: Orthopaedic Surgery

## 2019-05-06 ENCOUNTER — Encounter: Payer: Self-pay | Admitting: Orthopaedic Surgery

## 2019-05-06 ENCOUNTER — Other Ambulatory Visit: Payer: Self-pay

## 2019-05-06 DIAGNOSIS — M25561 Pain in right knee: Secondary | ICD-10-CM | POA: Diagnosis not present

## 2019-05-06 DIAGNOSIS — G8929 Other chronic pain: Secondary | ICD-10-CM

## 2019-05-06 NOTE — Progress Notes (Signed)
The patient is following up after having an MRI of his right knee.  He injured this knee over 6 months ago in March of this year.  He was squatted for a long period of time working on a job and stood up and felt a pop in his knee.  It has been tender ever since then and has been getting worse.  Pivoting activities that cause a lot of problem of the knee.  He has tried failed other forms conservative treatment.  On examination hurts over the medial femoral condyle and the posterior medial aspect of the knee.  This is on the right side.  There is a mild effusion.  He has painful range of motion past 90 degrees of the knee with a positive McMurray signs of medial compartment.  MRI of his right knee does show posterior horn horizontal medial meniscal tear.  There is a parameniscal cyst in the area as well.  There is a small focus of subchondral edema in this area and the medial femoral condyle.  There is also a slight area of full-thickness cartilage loss in the trochlear groove.  There is thinning of the medial compartment cartilage.  I did show him a knee model explained in detail what going on with his knee.  I am recommending an arthroscopic intervention with a partial medial meniscectomy and chondroplasty.  I do not feel that he needs a subchondroplasty based on the small area of edema but we will look at that closely.  All question concerns were answered and addressed.  He has been to consider surgery but is not talked to his wife.  I gave him our surgery scheduler's card.  I explained in detail what the surgery involves including the risk and benefits of surgery and what to expect for a postoperative course.

## 2019-08-28 ENCOUNTER — Other Ambulatory Visit: Payer: Self-pay | Admitting: Orthopaedic Surgery

## 2019-08-28 ENCOUNTER — Encounter: Payer: Self-pay | Admitting: Orthopaedic Surgery

## 2019-08-28 DIAGNOSIS — S83231D Complex tear of medial meniscus, current injury, right knee, subsequent encounter: Secondary | ICD-10-CM

## 2019-08-28 MED ORDER — HYDROCODONE-ACETAMINOPHEN 5-325 MG PO TABS: 1.0000 | ORAL_TABLET | ORAL | 0 refills | Status: DC | PRN

## 2019-09-04 ENCOUNTER — Encounter: Payer: Self-pay | Admitting: Orthopaedic Surgery

## 2019-09-04 ENCOUNTER — Other Ambulatory Visit: Payer: Self-pay

## 2019-09-04 ENCOUNTER — Ambulatory Visit (INDEPENDENT_AMBULATORY_CARE_PROVIDER_SITE_OTHER): Admitting: Orthopaedic Surgery

## 2019-09-04 DIAGNOSIS — Z9889 Other specified postprocedural states: Secondary | ICD-10-CM

## 2019-09-04 NOTE — Progress Notes (Signed)
Mr. Asato is 1 week out from a right knee arthroscopy.  He had a significant medial meniscal tear with a flap component.  We performed a partial medial meniscectomy.  He does report some knee stiffness and overall is doing well.  He did have some calf pain just after surgery but that is resolved.  On exam his right knee is slightly swollen.  He has good flexion extension is just a little stiff.  His calf is soft and there is no swelling in his foot or ankle.  I remove the sutures from his knee.  We did go over the arthroscopy pictures of his knee.  I explained what the surgery involves.  We talked about his postoperative course as well and how he can strengthen his knee with time.  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  I would like to see him back in 4 weeks from now to make sure he is doing well.  All questions and concerns were were answered and addressed.

## 2019-09-12 ENCOUNTER — Telehealth: Payer: Self-pay

## 2019-09-12 NOTE — Telephone Encounter (Signed)
Patient aware of the below message  

## 2019-09-12 NOTE — Telephone Encounter (Signed)
Patient called stating that he has some new pain in the right knee and about 8 inches of bruising on the back of the right knee.  Would like to know if this is normal or not?  Patient had right knee surgery on 08/28/2019.  Cb# [phone].  Please advise.  Thank you.

## 2019-09-12 NOTE — Telephone Encounter (Signed)
This can be normal following any knee surgery.  If it worsens, we can always see him in the office.  Reasurance that it should improve.

## 2019-09-30 ENCOUNTER — Telehealth: Payer: Self-pay | Admitting: Orthopaedic Surgery

## 2019-09-30 NOTE — Telephone Encounter (Signed)
Elijah Johnson from Dr. Manus Gunning office [facility] called requesting doctor's notes and surgical notes dated for April 20,2021. Elijah Johnson asked for notes to be faxed with Attention Elijah Johnson at fax number [phone]. Elijah Johnson phone number is [phone].

## 2019-09-30 NOTE — Telephone Encounter (Signed)
Faxed 08/28/2019 [facility] OP note and last ov note [phone]

## 2019-10-01 ENCOUNTER — Telehealth: Payer: Self-pay | Admitting: Orthopaedic Surgery

## 2019-10-01 NOTE — Telephone Encounter (Signed)
Received call from Elease Hashimoto concerning 08/26/2019 ov note,op note and surgical notes. Elease Hashimoto said the fax was not received and asked if Tammy could fax again to fax# [phone]    The number to contact Elease Hashimoto is [phone]

## 2019-10-02 ENCOUNTER — Ambulatory Visit (INDEPENDENT_AMBULATORY_CARE_PROVIDER_SITE_OTHER): Admitting: Orthopaedic Surgery

## 2019-10-02 ENCOUNTER — Other Ambulatory Visit: Payer: Self-pay

## 2019-10-02 DIAGNOSIS — Z9889 Other specified postprocedural states: Secondary | ICD-10-CM

## 2019-10-02 NOTE — Progress Notes (Signed)
HPI: Elijah Johnson comes back now approximately 5 weeks status post right knee arthroscopy he is overall doing well states he has good range of motion good strength.  He has had some calf discomfort but this is resolving.  He states this felt more like that gastroc strain.  No shortness of breath fevers chills.  Physical exam: Right knee good range of motion without pain.  Port sites well-healed no signs of infection.  Right calf supple nontender.  Impression 5-week status post right knee arthroscopy with with partial medial meniscectomy  Plan: He will continue to work on range of motion strengthening the knee.  We will keep him out of running exercises for the next 6 to 8 months as he recovers from surgery.  I can see him back in 1/2 months for reevaluation discussion on return back to full duties.

## 2019-10-02 NOTE — Telephone Encounter (Signed)
re-faxed

## 2019-12-04 ENCOUNTER — Telehealth: Payer: Self-pay | Admitting: Nurse Practitioner

## 2019-12-04 NOTE — Telephone Encounter (Signed)
Called to Discuss with patient about Covid symptoms and the use of bamlanivimab, a monoclonal antibody infusion for those with mild to moderate Covid symptoms and at a high risk of hospitalization.     Pt states that he is in [place].

## 2020-03-06 ENCOUNTER — Encounter (HOSPITAL_COMMUNITY): Payer: Self-pay | Admitting: Emergency Medicine

## 2020-03-06 ENCOUNTER — Inpatient Hospital Stay (HOSPITAL_COMMUNITY)
Admission: EM | Admit: 2020-03-06 | Discharge: 2020-03-10 | DRG: 515 | Disposition: A | Discharge status: Home / Self Care | Attending: General Surgery | Admitting: General Surgery

## 2020-03-06 ENCOUNTER — Other Ambulatory Visit: Payer: Self-pay

## 2020-03-06 ENCOUNTER — Emergency Department (HOSPITAL_COMMUNITY)

## 2020-03-06 DIAGNOSIS — S0240CA Maxillary fracture, right side, initial encounter for closed fracture: Secondary | ICD-10-CM | POA: Diagnosis not present

## 2020-03-06 DIAGNOSIS — T148XXA Other injury of unspecified body region, initial encounter: Secondary | ICD-10-CM

## 2020-03-06 DIAGNOSIS — M25562 Pain in left knee: Secondary | ICD-10-CM | POA: Diagnosis not present

## 2020-03-06 DIAGNOSIS — S42021B Displaced fracture of shaft of right clavicle, initial encounter for open fracture: Secondary | ICD-10-CM | POA: Diagnosis not present

## 2020-03-06 DIAGNOSIS — Z4682 Encounter for fitting and adjustment of non-vascular catheter: Secondary | ICD-10-CM

## 2020-03-06 DIAGNOSIS — Z9889 Other specified postprocedural states: Secondary | ICD-10-CM

## 2020-03-06 DIAGNOSIS — S42009A Fracture of unspecified part of unspecified clavicle, initial encounter for closed fracture: Secondary | ICD-10-CM | POA: Diagnosis present

## 2020-03-06 DIAGNOSIS — Y902 Blood alcohol level of 40-59 mg/100 ml: Secondary | ICD-10-CM | POA: Diagnosis present

## 2020-03-06 DIAGNOSIS — S20311A Abrasion of right front wall of thorax, initial encounter: Secondary | ICD-10-CM | POA: Diagnosis not present

## 2020-03-06 DIAGNOSIS — Z23 Encounter for immunization: Secondary | ICD-10-CM | POA: Diagnosis not present

## 2020-03-06 DIAGNOSIS — M25521 Pain in right elbow: Secondary | ICD-10-CM | POA: Diagnosis not present

## 2020-03-06 DIAGNOSIS — S270XXA Traumatic pneumothorax, initial encounter: Secondary | ICD-10-CM | POA: Diagnosis present

## 2020-03-06 DIAGNOSIS — Z7289 Other problems related to lifestyle: Secondary | ICD-10-CM

## 2020-03-06 DIAGNOSIS — S0101XA Laceration without foreign body of scalp, initial encounter: Secondary | ICD-10-CM | POA: Diagnosis not present

## 2020-03-06 DIAGNOSIS — D62 Acute posthemorrhagic anemia: Secondary | ICD-10-CM | POA: Diagnosis not present

## 2020-03-06 DIAGNOSIS — S0011XA Contusion of right eyelid and periocular area, initial encounter: Secondary | ICD-10-CM | POA: Diagnosis not present

## 2020-03-06 DIAGNOSIS — Z9689 Presence of other specified functional implants: Secondary | ICD-10-CM

## 2020-03-06 DIAGNOSIS — Z419 Encounter for procedure for purposes other than remedying health state, unspecified: Secondary | ICD-10-CM

## 2020-03-06 DIAGNOSIS — Y92410 Unspecified street and highway as the place of occurrence of the external cause: Secondary | ICD-10-CM | POA: Diagnosis not present

## 2020-03-06 DIAGNOSIS — S2241XA Multiple fractures of ribs, right side, initial encounter for closed fracture: Secondary | ICD-10-CM | POA: Diagnosis not present

## 2020-03-06 DIAGNOSIS — S27339A Laceration of lung, unspecified, initial encounter: Secondary | ICD-10-CM | POA: Diagnosis present

## 2020-03-06 DIAGNOSIS — Z20822 Contact with and (suspected) exposure to covid-19: Secondary | ICD-10-CM | POA: Diagnosis present

## 2020-03-06 DIAGNOSIS — S0231XA Fracture of orbital floor, right side, initial encounter for closed fracture: Secondary | ICD-10-CM | POA: Diagnosis present

## 2020-03-06 DIAGNOSIS — J939 Pneumothorax, unspecified: Secondary | ICD-10-CM

## 2020-03-06 DIAGNOSIS — T1490XA Injury, unspecified, initial encounter: Secondary | ICD-10-CM

## 2020-03-06 HISTORY — DX: Chronic kidney disease, unspecified: N18.9

## 2020-03-06 MED ORDER — IOHEXOL 350 MG/ML SOLN
100.0000 mL | Freq: Once | INTRAVENOUS | Status: AC | PRN
  Administered 2020-03-06: 100 mL via INTRAVENOUS

## 2020-03-06 MED ORDER — LACTATED RINGERS IV BOLUS
1000.0000 mL | Freq: Once | INTRAVENOUS | Status: AC
  Administered 2020-03-07: 1000 mL via INTRAVENOUS

## 2020-03-06 MED ORDER — FENTANYL CITRATE (PF) 100 MCG/2ML IJ SOLN
INTRAMUSCULAR | Status: AC
  Filled 2020-03-06: qty 2

## 2020-03-06 MED ORDER — FENTANYL CITRATE (PF) 100 MCG/2ML IJ SOLN
INTRAMUSCULAR | Status: AC | PRN
Start: 2020-03-06 — End: 2020-03-06
  Administered 2020-03-06: 100 ug via INTRAVENOUS

## 2020-03-06 MED ORDER — TETANUS-DIPHTH-ACELL PERTUSSIS 5-2.5-18.5 LF-MCG/0.5 IM SUSY
0.5000 mL | PREFILLED_SYRINGE | Freq: Once | INTRAMUSCULAR | Status: AC
  Administered 2020-03-06: 0.5 mL via INTRAMUSCULAR
  Filled 2020-03-06: qty 0.5

## 2020-03-06 MED ORDER — CEFAZOLIN SODIUM-DEXTROSE 2-4 GM/100ML-% IV SOLN
2.0000 g | Freq: Once | INTRAVENOUS | Status: AC
  Administered 2020-03-06: 2 g via INTRAVENOUS
  Filled 2020-03-06: qty 100

## 2020-03-06 NOTE — ED Notes (Signed)
Dr. Clayborne Dana placed staples in head lac at bedside
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
## 2020-03-06 NOTE — ED Notes (Signed)
Pt transferred to CT.
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
## 2020-03-07 ENCOUNTER — Inpatient Hospital Stay (HOSPITAL_COMMUNITY): Admitting: Certified Registered"

## 2020-03-07 ENCOUNTER — Emergency Department (HOSPITAL_COMMUNITY)

## 2020-03-07 ENCOUNTER — Inpatient Hospital Stay (HOSPITAL_COMMUNITY)

## 2020-03-07 ENCOUNTER — Encounter (HOSPITAL_COMMUNITY): Admission: EM | Disposition: A | Discharge status: Home / Self Care | Payer: Self-pay | Source: Home / Self Care

## 2020-03-07 ENCOUNTER — Encounter (HOSPITAL_COMMUNITY): Payer: Self-pay

## 2020-03-07 DIAGNOSIS — D62 Acute posthemorrhagic anemia: Secondary | ICD-10-CM | POA: Diagnosis not present

## 2020-03-07 DIAGNOSIS — Y902 Blood alcohol level of 40-59 mg/100 ml: Secondary | ICD-10-CM | POA: Diagnosis present

## 2020-03-07 DIAGNOSIS — S0011XA Contusion of right eyelid and periocular area, initial encounter: Secondary | ICD-10-CM | POA: Diagnosis present

## 2020-03-07 DIAGNOSIS — Y92410 Unspecified street and highway as the place of occurrence of the external cause: Secondary | ICD-10-CM | POA: Diagnosis not present

## 2020-03-07 DIAGNOSIS — S0231XA Fracture of orbital floor, right side, initial encounter for closed fracture: Secondary | ICD-10-CM | POA: Diagnosis present

## 2020-03-07 DIAGNOSIS — Z23 Encounter for immunization: Secondary | ICD-10-CM | POA: Diagnosis not present

## 2020-03-07 DIAGNOSIS — S42021B Displaced fracture of shaft of right clavicle, initial encounter for open fracture: Secondary | ICD-10-CM | POA: Diagnosis present

## 2020-03-07 DIAGNOSIS — S42009A Fracture of unspecified part of unspecified clavicle, initial encounter for closed fracture: Secondary | ICD-10-CM | POA: Diagnosis present

## 2020-03-07 DIAGNOSIS — S270XXA Traumatic pneumothorax, initial encounter: Secondary | ICD-10-CM | POA: Diagnosis present

## 2020-03-07 DIAGNOSIS — S2241XA Multiple fractures of ribs, right side, initial encounter for closed fracture: Secondary | ICD-10-CM | POA: Diagnosis present

## 2020-03-07 DIAGNOSIS — Z20822 Contact with and (suspected) exposure to covid-19: Secondary | ICD-10-CM | POA: Diagnosis present

## 2020-03-07 DIAGNOSIS — S20311A Abrasion of right front wall of thorax, initial encounter: Secondary | ICD-10-CM | POA: Diagnosis present

## 2020-03-07 DIAGNOSIS — M25562 Pain in left knee: Secondary | ICD-10-CM | POA: Diagnosis present

## 2020-03-07 DIAGNOSIS — M25521 Pain in right elbow: Secondary | ICD-10-CM | POA: Diagnosis present

## 2020-03-07 DIAGNOSIS — S27339A Laceration of lung, unspecified, initial encounter: Secondary | ICD-10-CM | POA: Diagnosis present

## 2020-03-07 DIAGNOSIS — Z7289 Other problems related to lifestyle: Secondary | ICD-10-CM | POA: Diagnosis not present

## 2020-03-07 DIAGNOSIS — S0240CA Maxillary fracture, right side, initial encounter for closed fracture: Secondary | ICD-10-CM | POA: Diagnosis present

## 2020-03-07 DIAGNOSIS — S0101XA Laceration without foreign body of scalp, initial encounter: Secondary | ICD-10-CM | POA: Diagnosis present

## 2020-03-07 HISTORY — PX: SCALP LACERATION REPAIR: SHX6089

## 2020-03-07 HISTORY — PX: CHEST TUBE INSERTION: SHX231

## 2020-03-07 HISTORY — PX: ORIF CLAVICULAR FRACTURE: SHX5055

## 2020-03-07 LAB — CBC
HCT: 46.2 % (ref 39.0–52.0)
HCT: 40.4 % (ref 39.0–52.0)
Hemoglobin: 15.4 g/dL (ref 13.0–17.0)
Hemoglobin: 13.6 g/dL (ref 13.0–17.0)
MCH: 29.6 pg (ref 26.0–34.0)
MCH: 29.7 pg (ref 26.0–34.0)
MCHC: 33.3 g/dL (ref 30.0–36.0)
MCHC: 33.7 g/dL (ref 30.0–36.0)
MCV: 88.8 fL (ref 80.0–100.0)
MCV: 88.2 fL (ref 80.0–100.0)
Platelets: 262 10*3/uL (ref 150–400)
Platelets: 186 K/uL (ref 150–400)
RBC: 5.2 MIL/uL (ref 4.22–5.81)
RBC: 4.58 MIL/uL (ref 4.22–5.81)
RDW: 12.6 % (ref 11.5–15.5)
RDW: 12.9 % (ref 11.5–15.5)
WBC: 14.4 10*3/uL — ABNORMAL HIGH (ref 4.0–10.5)
WBC: 11 K/uL — ABNORMAL HIGH (ref 4.0–10.5)
nRBC: 0 % (ref 0.0–0.2)
nRBC: 0 % (ref 0.0–0.2)

## 2020-03-07 LAB — URINALYSIS, ROUTINE W REFLEX MICROSCOPIC
Bacteria, UA: NONE SEEN
Bilirubin Urine: NEGATIVE
Glucose, UA: 150 mg/dL — AB
Ketones, ur: NEGATIVE mg/dL
Leukocytes,Ua: NEGATIVE
Nitrite: NEGATIVE
Protein, ur: 30 mg/dL — AB
Specific Gravity, Urine: 1.029 (ref 1.005–1.030)
pH: 5 (ref 5.0–8.0)

## 2020-03-07 LAB — I-STAT CHEM 8, ED
BUN: 17 mg/dL (ref 6–20)
Calcium, Ion: 1.17 mmol/L (ref 1.15–1.40)
Chloride: 104 mmol/L (ref 98–111)
Creatinine, Ser: 1.5 mg/dL — ABNORMAL HIGH (ref 0.61–1.24)
Glucose, Bld: 89 mg/dL (ref 70–99)
HCT: 45 % (ref 39.0–52.0)
Hemoglobin: 15.3 g/dL (ref 13.0–17.0)
Potassium: 3 mmol/L — ABNORMAL LOW (ref 3.5–5.1)
Sodium: 141 mmol/L (ref 135–145)
TCO2: 24 mmol/L (ref 22–32)

## 2020-03-07 LAB — RESPIRATORY PANEL BY RT PCR (FLU A&B, COVID)
Influenza A by PCR: NEGATIVE
Influenza B by PCR: NEGATIVE
SARS Coronavirus 2 by RT PCR: NEGATIVE

## 2020-03-07 LAB — COMPREHENSIVE METABOLIC PANEL
ALT: 51 U/L — ABNORMAL HIGH (ref 0–44)
AST: 63 U/L — ABNORMAL HIGH (ref 15–41)
Albumin: 4.3 g/dL (ref 3.5–5.0)
Alkaline Phosphatase: 35 U/L — ABNORMAL LOW (ref 38–126)
Anion gap: 13 (ref 5–15)
BUN: 14 mg/dL (ref 6–20)
CO2: 21 mmol/L — ABNORMAL LOW (ref 22–32)
Calcium: 9 mg/dL (ref 8.9–10.3)
Chloride: 102 mmol/L (ref 98–111)
Creatinine, Ser: 1.36 mg/dL — ABNORMAL HIGH (ref 0.61–1.24)
Glucose, Bld: 95 mg/dL (ref 70–99)
Potassium: 2.8 mmol/L — ABNORMAL LOW (ref 3.5–5.1)
Sodium: 136 mmol/L (ref 135–145)
Total Bilirubin: 0.6 mg/dL (ref 0.3–1.2)
Total Protein: 6.5 g/dL (ref 6.5–8.1)

## 2020-03-07 LAB — BASIC METABOLIC PANEL WITH GFR
Anion gap: 12 (ref 5–15)
BUN: 13 mg/dL (ref 6–20)
CO2: 24 mmol/L (ref 22–32)
Calcium: 8.7 mg/dL — ABNORMAL LOW (ref 8.9–10.3)
Chloride: 100 mmol/L (ref 98–111)
Creatinine, Ser: 1.17 mg/dL (ref 0.61–1.24)
GFR, Estimated: >60 mL/min
Glucose, Bld: 160 mg/dL — ABNORMAL HIGH (ref 70–99)
Potassium: 3.9 mmol/L (ref 3.5–5.1)
Sodium: 136 mmol/L (ref 135–145)

## 2020-03-07 LAB — LACTIC ACID, PLASMA: Lactic Acid, Venous: 2.7 mmol/L (ref 0.5–1.9)

## 2020-03-07 LAB — PROTIME-INR
INR: 1 (ref 0.8–1.2)
Prothrombin Time: 12.9 seconds (ref 11.4–15.2)

## 2020-03-07 LAB — SAMPLE TO BLOOD BANK

## 2020-03-07 LAB — ETHANOL: Alcohol, Ethyl (B): 43 mg/dL — ABNORMAL HIGH (ref <10)

## 2020-03-07 LAB — CDS SEROLOGY

## 2020-03-07 SURGERY — OPEN REDUCTION INTERNAL FIXATION (ORIF) CLAVICULAR FRACTURE
Anesthesia: General | Site: Shoulder | Laterality: Right

## 2020-03-07 MED ORDER — ONDANSETRON HCL 4 MG PO TABS: 4.0000 mg | ORAL_TABLET | Freq: Four times a day (QID) | ORAL | Status: DC | PRN

## 2020-03-07 MED ORDER — MORPHINE SULFATE (PF) 4 MG/ML IV SOLN
4.0000 mg | INTRAVENOUS | Status: DC | PRN
  Administered 2020-03-07 (×2): 4 mg via INTRAVENOUS
  Filled 2020-03-07 (×2): qty 1

## 2020-03-07 MED ORDER — HYDROMORPHONE HCL 1 MG/ML IJ SOLN
0.5000 mg | INTRAMUSCULAR | Status: DC | PRN
  Administered 2020-03-07: 1 mg via INTRAVENOUS
  Administered 2020-03-07: 0.5 mg via INTRAVENOUS
  Administered 2020-03-08 – 2020-03-10 (×6): 1 mg via INTRAVENOUS
  Filled 2020-03-07 (×7): qty 1
  Filled 2020-03-07: qty 0.5

## 2020-03-07 MED ORDER — LACTATED RINGERS IV SOLN: INTRAVENOUS | Status: DC

## 2020-03-07 MED ORDER — PROPOFOL 10 MG/ML IV BOLUS
INTRAVENOUS | Status: AC
  Filled 2020-03-07: qty 20

## 2020-03-07 MED ORDER — ONDANSETRON HCL 4 MG/2ML IJ SOLN
INTRAMUSCULAR | Status: AC
  Filled 2020-03-07: qty 2

## 2020-03-07 MED ORDER — BUPIVACAINE-EPINEPHRINE 0.5% -1:200000 IJ SOLN
INTRAMUSCULAR | Status: DC | PRN
  Administered 2020-03-07: 20 mL

## 2020-03-07 MED ORDER — ROCURONIUM 10MG/ML (10ML) SYRINGE FOR MEDFUSION PUMP - OPTIME
INTRAVENOUS | Status: DC | PRN
  Administered 2020-03-07: 50 mg via INTRAVENOUS

## 2020-03-07 MED ORDER — SUFENTANIL CITRATE 50 MCG/ML IV SOLN
INTRAVENOUS | Status: AC
  Filled 2020-03-07: qty 1

## 2020-03-07 MED ORDER — ONDANSETRON HCL 4 MG/2ML IJ SOLN
4.0000 mg | Freq: Four times a day (QID) | INTRAMUSCULAR | Status: DC | PRN
  Administered 2020-03-07 – 2020-03-08 (×2): 4 mg via INTRAVENOUS
  Filled 2020-03-07 (×2): qty 2

## 2020-03-07 MED ORDER — VANCOMYCIN HCL 1000 MG IV SOLR
INTRAVENOUS | Status: DC | PRN
  Administered 2020-03-07: 1000 mg via TOPICAL

## 2020-03-07 MED ORDER — DOCUSATE SODIUM 100 MG PO CAPS
100.0000 mg | ORAL_CAPSULE | Freq: Two times a day (BID) | ORAL | Status: DC
  Administered 2020-03-07 – 2020-03-10 (×7): 100 mg via ORAL
  Filled 2020-03-07 (×7): qty 1

## 2020-03-07 MED ORDER — SUFENTANIL CITRATE 50 MCG/ML IV SOLN
INTRAVENOUS | Status: DC | PRN
Start: 2020-03-07 — End: 2020-03-07
  Administered 2020-03-07 (×3): 10 ug via INTRAVENOUS
  Administered 2020-03-07: 20 ug via INTRAVENOUS

## 2020-03-07 MED ORDER — LACTATED RINGERS IV SOLN: INTRAVENOUS | Status: DC | PRN

## 2020-03-07 MED ORDER — ONDANSETRON HCL 4 MG/2ML IJ SOLN
4.0000 mg | Freq: Four times a day (QID) | INTRAMUSCULAR | Status: DC | PRN
  Administered 2020-03-07: 4 mg via INTRAVENOUS
  Filled 2020-03-07: qty 2

## 2020-03-07 MED ORDER — METOCLOPRAMIDE HCL 5 MG/ML IJ SOLN: 5.0000 mg | Freq: Three times a day (TID) | INTRAMUSCULAR | Status: DC | PRN

## 2020-03-07 MED ORDER — PROPOFOL 10 MG/ML IV BOLUS
INTRAVENOUS | Status: DC | PRN
  Administered 2020-03-07: 130 mg via INTRAVENOUS

## 2020-03-07 MED ORDER — BACITRACIN-NEOMYCIN-POLYMYXIN OINTMENT TUBE
TOPICAL_OINTMENT | CUTANEOUS | Status: AC
  Filled 2020-03-07: qty 14.17

## 2020-03-07 MED ORDER — TOBRAMYCIN SULFATE 1.2 G IJ SOLR
INTRAMUSCULAR | Status: AC
  Filled 2020-03-07: qty 1.2

## 2020-03-07 MED ORDER — METHOCARBAMOL 500 MG PO TABS
1000.0000 mg | ORAL_TABLET | Freq: Three times a day (TID) | ORAL | Status: DC
  Administered 2020-03-07 – 2020-03-10 (×9): 1000 mg via ORAL
  Filled 2020-03-07 (×10): qty 2

## 2020-03-07 MED ORDER — LACTATED RINGERS IV BOLUS
1000.0000 mL | Freq: Once | INTRAVENOUS | Status: AC
  Administered 2020-03-07: 1000 mL via INTRAVENOUS

## 2020-03-07 MED ORDER — VANCOMYCIN HCL 1000 MG IV SOLR
INTRAVENOUS | Status: AC
  Filled 2020-03-07: qty 1000

## 2020-03-07 MED ORDER — SUCCINYLCHOLINE CHLORIDE 200 MG/10ML IV SOSY
PREFILLED_SYRINGE | INTRAVENOUS | Status: DC | PRN
  Administered 2020-03-07: 160 mg via INTRAVENOUS

## 2020-03-07 MED ORDER — HYDROMORPHONE HCL 1 MG/ML IJ SOLN
1.0000 mg | Freq: Once | INTRAMUSCULAR | Status: AC
  Administered 2020-03-07: 1 mg via INTRAVENOUS
  Filled 2020-03-07: qty 1

## 2020-03-07 MED ORDER — ONDANSETRON HCL 4 MG/2ML IJ SOLN
INTRAMUSCULAR | Status: DC | PRN
  Administered 2020-03-07: 4 mg via INTRAVENOUS

## 2020-03-07 MED ORDER — CEFAZOLIN SODIUM-DEXTROSE 2-3 GM-%(50ML) IV SOLR
INTRAVENOUS | Status: DC | PRN
  Administered 2020-03-07: 2 g via INTRAVENOUS

## 2020-03-07 MED ORDER — METOCLOPRAMIDE HCL 5 MG PO TABS: 5.0000 mg | ORAL_TABLET | Freq: Three times a day (TID) | ORAL | Status: DC | PRN

## 2020-03-07 MED ORDER — POTASSIUM CHLORIDE IN NACL 20-0.9 MEQ/L-% IV SOLN
INTRAVENOUS | Status: DC
  Filled 2020-03-07 (×4): qty 1000

## 2020-03-07 MED ORDER — OXYCODONE HCL 5 MG PO TABS: 5.0000 mg | ORAL_TABLET | ORAL | Status: DC | PRN

## 2020-03-07 MED ORDER — POLYETHYLENE GLYCOL 3350 17 G PO PACK: 17.0000 g | PACK | Freq: Every day | ORAL | Status: DC | PRN

## 2020-03-07 MED ORDER — LIDOCAINE 2% (20 MG/ML) 5 ML SYRINGE
INTRAMUSCULAR | Status: AC
  Filled 2020-03-07: qty 5

## 2020-03-07 MED ORDER — BACITRACIN-NEOMYCIN-POLYMYXIN OINTMENT TUBE
TOPICAL_OINTMENT | CUTANEOUS | Status: DC | PRN
  Administered 2020-03-07: 1 via TOPICAL

## 2020-03-07 MED ORDER — OXYCODONE HCL 5 MG PO TABS
5.0000 mg | ORAL_TABLET | ORAL | Status: DC | PRN
  Administered 2020-03-08 (×2): 5 mg via ORAL
  Filled 2020-03-07 (×2): qty 2

## 2020-03-07 MED ORDER — IOHEXOL 350 MG/ML SOLN
100.0000 mL | Freq: Once | INTRAVENOUS | Status: AC | PRN
  Administered 2020-03-07: 100 mL via INTRAVENOUS

## 2020-03-07 MED ORDER — EPHEDRINE SULFATE 50 MG/ML IJ SOLN
INTRAMUSCULAR | Status: DC | PRN
  Administered 2020-03-07 (×2): 10 mg via INTRAVENOUS

## 2020-03-07 MED ORDER — SUGAMMADEX SODIUM 200 MG/2ML IV SOLN
INTRAVENOUS | Status: DC | PRN
  Administered 2020-03-07: 200 mg via INTRAVENOUS

## 2020-03-07 MED ORDER — SUCCINYLCHOLINE CHLORIDE 200 MG/10ML IV SOSY
PREFILLED_SYRINGE | INTRAVENOUS | Status: AC
  Filled 2020-03-07: qty 10

## 2020-03-07 MED ORDER — ACETAMINOPHEN 500 MG PO TABS
1000.0000 mg | ORAL_TABLET | Freq: Four times a day (QID) | ORAL | Status: DC
  Administered 2020-03-07 – 2020-03-10 (×10): 1000 mg via ORAL
  Filled 2020-03-07 (×11): qty 2

## 2020-03-07 MED ORDER — SODIUM CHLORIDE 0.9 % IV SOLN
2.0000 g | INTRAVENOUS | Status: AC
  Administered 2020-03-07 – 2020-03-09 (×3): 2 g via INTRAVENOUS
  Filled 2020-03-07 (×3): qty 20

## 2020-03-07 MED ORDER — 0.9 % SODIUM CHLORIDE (POUR BTL) OPTIME
TOPICAL | Status: DC | PRN
  Administered 2020-03-07: 1000 mL

## 2020-03-07 MED ORDER — ORAL CARE MOUTH RINSE: 15.0000 mL | Freq: Once | OROMUCOSAL | Status: DC

## 2020-03-07 MED ORDER — DEXAMETHASONE SODIUM PHOSPHATE 10 MG/ML IJ SOLN
INTRAMUSCULAR | Status: DC | PRN
  Administered 2020-03-07: 10 mg via INTRAVENOUS

## 2020-03-07 MED ORDER — ONDANSETRON 4 MG PO TBDP: 4.0000 mg | ORAL_TABLET | Freq: Four times a day (QID) | ORAL | Status: DC | PRN

## 2020-03-07 MED ORDER — CEFAZOLIN SODIUM-DEXTROSE 2-4 GM/100ML-% IV SOLN
INTRAVENOUS | Status: AC
  Filled 2020-03-07: qty 100

## 2020-03-07 MED ORDER — DEXAMETHASONE SODIUM PHOSPHATE 10 MG/ML IJ SOLN
INTRAMUSCULAR | Status: AC
  Filled 2020-03-07: qty 1

## 2020-03-07 MED ORDER — MIDAZOLAM HCL 2 MG/2ML IJ SOLN
INTRAMUSCULAR | Status: AC
  Filled 2020-03-07: qty 2

## 2020-03-07 MED ORDER — SODIUM CHLORIDE 0.9 % IR SOLN
Status: DC | PRN
  Administered 2020-03-07: 3000 mL

## 2020-03-07 MED ORDER — CHLORHEXIDINE GLUCONATE 0.12 % MT SOLN
OROMUCOSAL | Status: AC
  Filled 2020-03-07: qty 15

## 2020-03-07 MED ORDER — BUPIVACAINE-EPINEPHRINE 0.5% -1:200000 IJ SOLN
INTRAMUSCULAR | Status: AC
  Filled 2020-03-07: qty 1

## 2020-03-07 MED ORDER — MIDAZOLAM HCL 2 MG/2ML IJ SOLN
INTRAMUSCULAR | Status: DC | PRN
  Administered 2020-03-07: 2 mg via INTRAVENOUS

## 2020-03-07 MED ORDER — SODIUM CHLORIDE (PF) 0.9 % IJ SOLN
INTRAMUSCULAR | Status: AC
  Filled 2020-03-07: qty 10

## 2020-03-07 MED ORDER — TOBRAMYCIN SULFATE 1.2 G IJ SOLR
INTRAMUSCULAR | Status: DC | PRN
  Administered 2020-03-07: 1.2 g via TOPICAL

## 2020-03-07 MED ORDER — ENOXAPARIN SODIUM 30 MG/0.3ML ~~LOC~~ SOLN
30.0000 mg | Freq: Two times a day (BID) | SUBCUTANEOUS | Status: DC
  Administered 2020-03-08 – 2020-03-10 (×5): 30 mg via SUBCUTANEOUS
  Filled 2020-03-07 (×5): qty 0.3

## 2020-03-07 MED ORDER — OXYCODONE HCL 5 MG PO TABS
10.0000 mg | ORAL_TABLET | ORAL | Status: DC | PRN
  Administered 2020-03-07: 15 mg via ORAL
  Administered 2020-03-07: 10 mg via ORAL
  Administered 2020-03-08: 15 mg via ORAL
  Filled 2020-03-07: qty 2
  Filled 2020-03-07 (×4): qty 3

## 2020-03-07 MED ORDER — LIDOCAINE 2% (20 MG/ML) 5 ML SYRINGE
INTRAMUSCULAR | Status: DC | PRN
  Administered 2020-03-07: 100 mg via INTRAVENOUS

## 2020-03-07 MED ORDER — DOCUSATE SODIUM 100 MG PO CAPS: 100.0000 mg | ORAL_CAPSULE | Freq: Two times a day (BID) | ORAL | Status: DC

## 2020-03-07 MED ORDER — CHLORHEXIDINE GLUCONATE 0.12 % MT SOLN: 15.0000 mL | Freq: Once | OROMUCOSAL | Status: DC

## 2020-03-07 MED ORDER — ROCURONIUM BROMIDE 10 MG/ML (PF) SYRINGE
PREFILLED_SYRINGE | INTRAVENOUS | Status: AC
  Filled 2020-03-07: qty 10

## 2020-03-07 SURGICAL SUPPLY — 67 items
ADH SKN CLS APL DERMABOND .7 (GAUZE/BANDAGES/DRESSINGS)
APL PRP STRL LF DISP 70% ISPRP (MISCELLANEOUS) ×3
BIT DRILL CLAV ALPS 2.7X145 (BIT) ×1 IMPLANT
BNDG COHESIVE 4X5 TAN STRL (GAUZE/BANDAGES/DRESSINGS) ×1 IMPLANT
BRUSH SCRUB EZ PLAIN DRY (MISCELLANEOUS) ×7 IMPLANT
CHLORAPREP W/TINT 26 (MISCELLANEOUS) ×4 IMPLANT
CLSR STERI-STRIP ANTIMIC 1/2X4 (GAUZE/BANDAGES/DRESSINGS) IMPLANT
COVER SURGICAL LIGHT HANDLE (MISCELLANEOUS) ×8 IMPLANT
DERMABOND ADVANCED (GAUZE/BANDAGES/DRESSINGS)
DERMABOND ADVANCED .7 DNX12 (GAUZE/BANDAGES/DRESSINGS) ×6 IMPLANT
DRAPE C-ARM 42X72 X-RAY (DRAPES) ×4 IMPLANT
DRAPE INCISE IOBAN 66X45 STRL (DRAPES) ×4 IMPLANT
DRAPE ORTHO SPLIT 77X108 STRL (DRAPES) ×8
DRAPE SURG ORHT 6 SPLT 77X108 (DRAPES) ×6 IMPLANT
DRAPE U-SHAPE 47X51 STRL (DRAPES) ×8 IMPLANT
DRSG MEPILEX BORDER 4X8 (GAUZE/BANDAGES/DRESSINGS) ×4 IMPLANT
ELECT REM PT RETURN 9FT ADLT (ELECTROSURGICAL) ×4
ELECTRODE REM PT RTRN 9FT ADLT (ELECTROSURGICAL) ×3 IMPLANT
GAUZE SPONGE 4X4 12PLY STRL (GAUZE/BANDAGES/DRESSINGS) ×1 IMPLANT
GAUZE VASELINE 3X9 (GAUZE/BANDAGES/DRESSINGS) ×1 IMPLANT
GLOVE BIO SURGEON STRL SZ 6.5 (GLOVE) ×12 IMPLANT
GLOVE BIO SURGEON STRL SZ7.5 (GLOVE) ×13 IMPLANT
GLOVE BIOGEL PI IND STRL 6.5 (GLOVE) ×3 IMPLANT
GLOVE BIOGEL PI IND STRL 7.5 (GLOVE) ×3 IMPLANT
GLOVE BIOGEL PI INDICATOR 6.5 (GLOVE) ×2
GLOVE BIOGEL PI INDICATOR 7.5 (GLOVE) ×2
GOWN STRL REUS W/ TWL LRG LVL3 (GOWN DISPOSABLE) ×6 IMPLANT
GOWN STRL REUS W/TWL LRG LVL3 (GOWN DISPOSABLE) ×16
K-WIRE TROCHAR TIP ALPS 1.6 (WIRE) ×8
KIT BASIN OR (CUSTOM PROCEDURE TRAY) ×4 IMPLANT
KIT TURNOVER KIT B (KITS) ×4 IMPLANT
KWIRE TROCHAR TIP ALPS 1.6 (WIRE) IMPLANT
MANIFOLD NEPTUNE II (INSTRUMENTS) ×4 IMPLANT
NDL HYPO 25GX1X1/2 BEV (NEEDLE) IMPLANT
NEEDLE HYPO 25GX1X1/2 BEV (NEEDLE) ×4 IMPLANT
NS IRRIG 1000ML POUR BTL (IV SOLUTION) ×4 IMPLANT
PACK GENERAL/GYN (CUSTOM PROCEDURE TRAY) ×4 IMPLANT
PAD ARMBOARD 7.5X6 YLW CONV (MISCELLANEOUS) ×8 IMPLANT
PLATE CLAV SUP LT 10H 110MM NS (Plate) ×1 IMPLANT
SCREW CORT LP 3.5X12 (Screw) ×2 IMPLANT
SCREW CORT LP 3.5X14 (Screw) ×2 IMPLANT
SCREW CORT LP T15 3.5X16 (Screw) ×2 IMPLANT
SCREW LP NL T15 3.5X20 (Screw) ×2 IMPLANT
SCREW LP NL T15 3.5X24 (Screw) ×1 IMPLANT
SCREW LP NL T15 3.5X26 (Screw) ×1 IMPLANT
SCREW TIS LP 3.5X18 NS (Screw) ×1 IMPLANT
SET CYSTO W/LG BORE CLAMP LF (SET/KITS/TRAYS/PACK) ×1 IMPLANT
SLING ARM FOAM STRAP LRG (SOFTGOODS) ×1 IMPLANT
SLING ARM IMMOBILIZER LRG (SOFTGOODS) IMPLANT
SLING ARM IMMOBILIZER MED (SOFTGOODS) IMPLANT
STAPLER VISISTAT 35W (STAPLE) ×4 IMPLANT
STOCKINETTE IMPERVIOUS 9X36 MD (GAUZE/BANDAGES/DRESSINGS) ×1 IMPLANT
SUCTION FRAZIER HANDLE 10FR (MISCELLANEOUS) ×4
SUCTION TUBE FRAZIER 10FR DISP (MISCELLANEOUS) ×3 IMPLANT
SUT ETHILON 3 0 PS 1 (SUTURE) ×1 IMPLANT
SUT MNCRL AB 3-0 PS2 27 (SUTURE) ×4 IMPLANT
SUT MON AB 2-0 CT1 36 (SUTURE) ×2 IMPLANT
SUT PDS AB 0 CT1 27 (SUTURE) ×1 IMPLANT
SUT VIC AB 0 CT1 27 (SUTURE) ×8
SUT VIC AB 0 CT1 27XBRD ANBCTR (SUTURE) ×3 IMPLANT
SUT VIC AB 2-0 CT1 27 (SUTURE) ×8
SUT VIC AB 2-0 CT1 TAPERPNT 27 (SUTURE) ×3 IMPLANT
SYR CONTROL 10ML LL (SYRINGE) IMPLANT
SYSTEM SAHARA CHEST DRAIN ATS (WOUND CARE) ×1 IMPLANT
TOWEL GREEN STERILE (TOWEL DISPOSABLE) ×4 IMPLANT
TRAY WAYNE PNEUMOTHORAX 14X18 (TRAY / TRAY PROCEDURE) ×1 IMPLANT
WATER STERILE IRR 1000ML POUR (IV SOLUTION) ×4 IMPLANT

## 2020-03-07 NOTE — Anesthesia Procedure Notes (Signed)
Procedure Name: Intubation Date/Time: 03/07/2020 9:35 AM Performed by: Claris Che, CRNA Pre-anesthesia Checklist: Patient identified, Emergency Drugs available, Suction available, Patient being monitored and Timeout performed Patient Re-evaluated:Patient Re-evaluated prior to induction Oxygen Delivery Method: Circle system utilized Preoxygenation: Pre-oxygenation with 100% oxygen Induction Type: IV induction, Rapid sequence and Cricoid Pressure applied Laryngoscope Size: Mac and 4 Grade View: Grade II Tube type: Oral Tube size: 7.5 mm Airway Equipment and Method: Stylet and Video-laryngoscopy Placement Confirmation: ETT inserted through vocal cords under direct vision,  positive ETCO2 and breath sounds checked- equal and bilateral Secured at: 23 cm Tube secured with: Tape Dental Injury: Teeth and Oropharynx as per pre-operative assessment

## 2020-03-07 NOTE — Progress Notes (Addendum)
0830 Pt is A&O x4, C-collar on. Right clavicle wound with minimal bleeding noted, dressing changed. Right scalp laceration with staples intact, minimal bleeding noted.  0840 Pt to CT for C-spine CT.  OR called, they will get pt from CT to short stay. Pt's wife made aware and went to short stay. Report was given to [facility] at Short stay. 1310 Received pt from PACU, A&O x4. Right chest tube intact,  -20cm H2O suction. Right clavicle dressing dry and intact. Ice pack placed. Right arm warm, fingers warm and mobile.  Incentive spirometer given and encouraged pt to use.

## 2020-03-07 NOTE — Progress Notes (Signed)
Ortho Trauma Note  Consult received by Dr. Bedelia Person. Patient will need I&D and ORIF of right clavicle. Will plan on later this AM. Open fracture prophylaxis with ancef. Formal consult to follow later this AM.  Roby Lofts, MD [facility] [phone] (office) [email or web address]

## 2020-03-07 NOTE — Progress Notes (Signed)
Pt arrived in the unit from the ED to 5N27 with neck collar on. Multiple staples on right scalp, gushing open neck wound on right chest were noted with saturated gauze on it. Multple abrasions on arms, neck and legs were also noted. Patient was cleaned and was given CHG bath. Patient for surgery in AM.
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
## 2020-03-07 NOTE — ED Notes (Signed)
CSI at bedside.

## 2020-03-07 NOTE — ED Notes (Addendum)
Wife at bedside w/ Dr. Clayborne Dana

## 2020-03-07 NOTE — Progress Notes (Signed)
Orthopedic Tech Progress Note Patient Details:  Elijah Johnson 08-21-1975 [id number] Application of overhead frame Patient ID: Elijah Johnson, male   DOB: March 09, 1976, 44 y.o.   MRN: [id number]   [name] 03/07/2020, 6:50 PM

## 2020-03-07 NOTE — Anesthesia Preprocedure Evaluation (Signed)
Anesthesia Evaluation  Patient identified by MRN, date of birth, ID band Patient awake    Reviewed: Allergy & Precautions, NPO status , Patient's Chart, lab work & pertinent test results  Airway Mallampati: II  TM Distance: >3 FB Neck ROM: Full    Dental   Pulmonary    Pulmonary exam normal        Cardiovascular Normal cardiovascular exam     Neuro/Psych    GI/Hepatic   Endo/Other    Renal/GU Renal InsufficiencyRenal disease     Musculoskeletal   Abdominal   Peds  Hematology   Anesthesia Other Findings   Reproductive/Obstetrics                             Anesthesia Physical Anesthesia Plan  ASA: III and emergent  Anesthesia Plan: General   Post-op Pain Management:    Induction: Intravenous, Cricoid pressure planned and Rapid sequence  PONV Risk Score and Plan:   Airway Management Planned: Video Laryngoscope Planned  Additional Equipment:   Intra-op Plan:   Post-operative Plan: Extubation in OR  Informed Consent: I have reviewed the patients History and Physical, chart, labs and discussed the procedure including the risks, benefits and alternatives for the proposed anesthesia with the patient or authorized representative who has indicated his/her understanding and acceptance.       Plan Discussed with: CRNA and Surgeon  Anesthesia Plan Comments:         Anesthesia Quick Evaluation
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
## 2020-03-07 NOTE — Anesthesia Postprocedure Evaluation (Signed)
Anesthesia Post Note  Patient: Elijah Johnson  Procedure(s) Performed: OPEN REDUCTION INTERNAL FIXATION (ORIF) CLAVICULAR FRACTURE (Right Shoulder) SCALP LACERATION REPAIR (Right Scalp) CHEST TUBE INSERTION (Right Chest)     Patient location during evaluation: PACU Anesthesia Type: General Level of consciousness: awake and alert Pain management: pain level controlled Vital Signs Assessment: post-procedure vital signs reviewed and stable Respiratory status: spontaneous breathing, nonlabored ventilation, respiratory function stable and patient connected to nasal cannula oxygen Cardiovascular status: blood pressure returned to baseline and stable Postop Assessment: no apparent nausea or vomiting Anesthetic complications: no   No complications documented.  Last Vitals:  Vitals:   03/07/20 1210 03/07/20 1225  BP: (!) 158/98 129/87  Pulse: 91 87  Resp: 14 20  Temp: 36.6 C   SpO2: 96% 98%    Last Pain:  Vitals:   03/07/20 1225  TempSrc:   PainSc: 0-No pain                 [name] DAVID
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
## 2020-03-07 NOTE — Progress Notes (Addendum)
PT Cancellation Note  Patient Details Name: Elijah Johnson MRN: [id number] DOB: 27-May-1975   Cancelled Treatment:    Reason Eval/Treat Not Completed: Patient not medically ready Pt going to OR later this AM for ORIF of clavicle. Will follow post op.   [name] 03/07/2020, 6:55 AM [place], PT, DPT Acute Rehabilitation Services Pager [phone] Office [phone]

## 2020-03-07 NOTE — Progress Notes (Signed)
Patient ID: Elijah Johnson, male   DOB: 12-10-1975, 44 y.o.   MRN: [id number] Ct cervical spine negative, exam benign, I cleared neck Also discussed right chest tube placement in OR prior to clavicle orif
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
## 2020-03-07 NOTE — Transfer of Care (Signed)
Immediate Anesthesia Transfer of Care Note  Patient: Elijah Johnson  Procedure(s) Performed: OPEN REDUCTION INTERNAL FIXATION (ORIF) CLAVICULAR FRACTURE (Right Shoulder) SCALP LACERATION REPAIR (Right Scalp) CHEST TUBE INSERTION (Right Chest)  Patient Location: PACU  Anesthesia Type:General  Level of Consciousness: oriented, sedated, drowsy, patient cooperative and responds to stimulation  Airway & Oxygen Therapy: Patient Spontanous Breathing and Patient connected to nasal cannula oxygen  Post-op Assessment: Report given to RN, Post -op Vital signs reviewed and stable and Patient moving all extremities X 4  Post vital signs: Reviewed and stable  Last Vitals:  Vitals Value Taken Time  BP 129/87 03/07/20 1222  Temp 36.6 C 03/07/20 1210  Pulse 91 03/07/20 1224  Resp 19 03/07/20 1224  SpO2 96 % 03/07/20 1224  Vitals shown include unvalidated device data.  Last Pain:  Vitals:   03/07/20 1210  TempSrc:   PainSc: Asleep      Patients Stated Pain Goal: 3 (03/07/20 0903)  Complications: No complications documented.

## 2020-03-08 ENCOUNTER — Inpatient Hospital Stay (HOSPITAL_COMMUNITY)

## 2020-03-08 ENCOUNTER — Encounter (HOSPITAL_COMMUNITY): Payer: Self-pay | Admitting: Student

## 2020-03-08 LAB — CBC
HCT: 36.2 % — ABNORMAL LOW (ref 39.0–52.0)
Hemoglobin: 12.1 g/dL — ABNORMAL LOW (ref 13.0–17.0)
MCH: 29.7 pg (ref 26.0–34.0)
MCHC: 33.4 g/dL (ref 30.0–36.0)
MCV: 88.7 fL (ref 80.0–100.0)
Platelets: 162 K/uL (ref 150–400)
RBC: 4.08 MIL/uL — ABNORMAL LOW (ref 4.22–5.81)
RDW: 12.9 % (ref 11.5–15.5)
WBC: 10.1 K/uL (ref 4.0–10.5)
nRBC: 0 % (ref 0.0–0.2)

## 2020-03-08 LAB — BASIC METABOLIC PANEL WITH GFR
Anion gap: 7 (ref 5–15)
BUN: 12 mg/dL (ref 6–20)
CO2: 24 mmol/L (ref 22–32)
Calcium: 8.5 mg/dL — ABNORMAL LOW (ref 8.9–10.3)
Chloride: 105 mmol/L (ref 98–111)
Creatinine, Ser: 1.03 mg/dL (ref 0.61–1.24)
GFR, Estimated: >60 mL/min
Glucose, Bld: 139 mg/dL — ABNORMAL HIGH (ref 70–99)
Potassium: 4.1 mmol/L (ref 3.5–5.1)
Sodium: 136 mmol/L (ref 135–145)

## 2020-03-08 LAB — VITAMIN D 25 HYDROXY (VIT D DEFICIENCY, FRACTURES): Vit D, 25-Hydroxy: 19.46 ng/mL — ABNORMAL LOW (ref 30–100)
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
## 2020-03-08 NOTE — Progress Notes (Signed)
Patient ID: Elijah Johnson, male   DOB: 11-Jun-1975, 44 y.o.   MRN: [id number]  Elijah Johnson is an 44 y.o. male.  HPI: doing well after surgery. He has no diplopia. No vision changes. He has some pain in the right upper teeth. No malocclusion  Past Medical History:  Diagnosis Date   Chronic kidney disease    CKD 3 per patients wife    History reviewed. No pertinent surgical history.  History reviewed. No pertinent family history.  Social History:  reports that he has never smoked. He has never used smokeless tobacco. He reports previous alcohol use. No history on file for drug use.  Allergies: No Known Allergies  Medications: I have reviewed the patient's current medications.  Results for orders placed or performed during the hospital encounter of 03/06/20 (from the past 48 hour(s))  CDS serology     Status: None   Collection Time: 03/06/20 11:09 PM  Result Value Ref Range   CDS serology specimen      SPECIMEN WILL BE HELD FOR 14 DAYS IF TESTING IS REQUIRED    Comment: Performed at [facility], [address] [place], [place] [address]  Comprehensive metabolic panel     Status: Abnormal   Collection Time: 03/06/20 11:09 PM  Result Value Ref Range   Sodium 136 135 - 145 mmol/L    Comment: QA FLAGS AND/OR RANGES MODIFIED BY DEMOGRAPHIC UPDATE ON 10/31 AT 0021 QA FLAGS AND/OR RANGES MODIFIED BY DEMOGRAPHIC UPDATE ON 10/31 AT 0043 QA FLAGS AND/OR RANGES MODIFIED BY DEMOGRAPHIC UPDATE ON 10/31 AT 0043 QA FLAGS AND/OR RANGES MODIFIED BY DEMOGRAPHIC UPDATE ON 10/31 AT 0044 QA FLAGS AND/OR RANGES MODIFIED BY DEMOGRAPHIC UPDATE ON 10/31 AT 0052    Potassium 2.8 (L) 3.5 - 5.1 mmol/L    Comment: QA FLAGS AND/OR RANGES MODIFIED BY DEMOGRAPHIC UPDATE ON 10/31 AT 0021 QA FLAGS AND/OR RANGES MODIFIED BY DEMOGRAPHIC UPDATE ON 10/31 AT 0043 QA FLAGS AND/OR RANGES MODIFIED BY DEMOGRAPHIC UPDATE ON 10/31 AT 0043 QA FLAGS AND/OR RANGES MODIFIED BY DEMOGRAPHIC UPDATE ON 10/31 AT 0044 QA  FLAGS AND/OR RANGES MODIFIED BY DEMOGRAPHIC UPDATE ON 10/31 AT 0052    Chloride 102 98 - 111 mmol/L    Comment: QA FLAGS AND/OR RANGES MODIFIED BY DEMOGRAPHIC UPDATE ON 10/31 AT 0021 QA FLAGS AND/OR RANGES MODIFIED BY DEMOGRAPHIC UPDATE ON 10/31 AT 0043 QA FLAGS AND/OR RANGES MODIFIED BY DEMOGRAPHIC UPDATE ON 10/31 AT 0043 QA FLAGS AND/OR RANGES MODIFIED BY DEMOGRAPHIC UPDATE ON 10/31 AT 0044 QA FLAGS AND/OR RANGES MODIFIED BY DEMOGRAPHIC UPDATE ON 10/31 AT 0052    CO2 21 (L) 22 - 32 mmol/L    Comment: QA FLAGS AND/OR RANGES MODIFIED BY DEMOGRAPHIC UPDATE ON 10/31 AT 0021 QA FLAGS AND/OR RANGES MODIFIED BY DEMOGRAPHIC UPDATE ON 10/31 AT 0043 QA FLAGS AND/OR RANGES MODIFIED BY DEMOGRAPHIC UPDATE ON 10/31 AT 0043 QA FLAGS AND/OR RANGES MODIFIED BY DEMOGRAPHIC UPDATE ON 10/31 AT 0044 QA FLAGS AND/OR RANGES MODIFIED BY DEMOGRAPHIC UPDATE ON 10/31 AT 0052    Glucose, Bld 95 70 - 99 mg/dL    Comment: Glucose reference range applies only to samples taken after fasting for at least 8 hours. QA FLAGS AND/OR RANGES MODIFIED BY DEMOGRAPHIC UPDATE ON 10/31 AT 0021 QA FLAGS AND/OR RANGES MODIFIED BY DEMOGRAPHIC UPDATE ON 10/31 AT 0043 QA FLAGS AND/OR RANGES MODIFIED BY DEMOGRAPHIC UPDATE ON 10/31 AT 0043 QA FLAGS AND/OR RANGES MODIFIED BY DEMOGRAPHIC UPDATE ON 10/31 AT 0044 QA FLAGS AND/OR  RANGES MODIFIED BY DEMOGRAPHIC UPDATE ON 10/31 AT 0052    BUN 14 6 - 20 mg/dL    Comment: QA FLAGS AND/OR RANGES MODIFIED BY DEMOGRAPHIC UPDATE ON 10/31 AT 0021 QA FLAGS AND/OR RANGES MODIFIED BY DEMOGRAPHIC UPDATE ON 10/31 AT 0043 QA FLAGS AND/OR RANGES MODIFIED BY DEMOGRAPHIC UPDATE ON 10/31 AT 0043 QA FLAGS AND/OR RANGES MODIFIED BY DEMOGRAPHIC UPDATE ON 10/31 AT 0044 QA FLAGS AND/OR RANGES MODIFIED BY DEMOGRAPHIC UPDATE ON 10/31 AT 0052    Creatinine, Ser 1.36 (H) 0.61 - 1.24 mg/dL    Comment: QA FLAGS AND/OR RANGES MODIFIED BY DEMOGRAPHIC UPDATE ON 10/31 AT 0021 QA FLAGS AND/OR RANGES MODIFIED BY  DEMOGRAPHIC UPDATE ON 10/31 AT 0043 QA FLAGS AND/OR RANGES MODIFIED BY DEMOGRAPHIC UPDATE ON 10/31 AT 0043 QA FLAGS AND/OR RANGES MODIFIED BY DEMOGRAPHIC UPDATE ON 10/31 AT 0044 QA FLAGS AND/OR RANGES MODIFIED BY DEMOGRAPHIC UPDATE ON 10/31 AT 0052    Calcium 9.0 8.9 - 10.3 mg/dL    Comment: QA FLAGS AND/OR RANGES MODIFIED BY DEMOGRAPHIC UPDATE ON 10/31 AT 0021 QA FLAGS AND/OR RANGES MODIFIED BY DEMOGRAPHIC UPDATE ON 10/31 AT 0043 QA FLAGS AND/OR RANGES MODIFIED BY DEMOGRAPHIC UPDATE ON 10/31 AT 0043 QA FLAGS AND/OR RANGES MODIFIED BY DEMOGRAPHIC UPDATE ON 10/31 AT 0044 QA FLAGS AND/OR RANGES MODIFIED BY DEMOGRAPHIC UPDATE ON 10/31 AT 0052    Total Protein 6.5 6.5 - 8.1 g/dL    Comment: QA FLAGS AND/OR RANGES MODIFIED BY DEMOGRAPHIC UPDATE ON 10/31 AT 0021 QA FLAGS AND/OR RANGES MODIFIED BY DEMOGRAPHIC UPDATE ON 10/31 AT 0043 QA FLAGS AND/OR RANGES MODIFIED BY DEMOGRAPHIC UPDATE ON 10/31 AT 0043 QA FLAGS AND/OR RANGES MODIFIED BY DEMOGRAPHIC UPDATE ON 10/31 AT 0044 QA FLAGS AND/OR RANGES MODIFIED BY DEMOGRAPHIC UPDATE ON 10/31 AT 0052    Albumin 4.3 3.5 - 5.0 g/dL    Comment: QA FLAGS AND/OR RANGES MODIFIED BY DEMOGRAPHIC UPDATE ON 10/31 AT 0021 QA FLAGS AND/OR RANGES MODIFIED BY DEMOGRAPHIC UPDATE ON 10/31 AT 0043 QA FLAGS AND/OR RANGES MODIFIED BY DEMOGRAPHIC UPDATE ON 10/31 AT 0043 QA FLAGS AND/OR RANGES MODIFIED BY DEMOGRAPHIC UPDATE ON 10/31 AT 0044 QA FLAGS AND/OR RANGES MODIFIED BY DEMOGRAPHIC UPDATE ON 10/31 AT 0052    AST 63 (H) 15 - 41 U/L    Comment: QA FLAGS AND/OR RANGES MODIFIED BY DEMOGRAPHIC UPDATE ON 10/31 AT 0021 QA FLAGS AND/OR RANGES MODIFIED BY DEMOGRAPHIC UPDATE ON 10/31 AT 0043 QA FLAGS AND/OR RANGES MODIFIED BY DEMOGRAPHIC UPDATE ON 10/31 AT 0043 QA FLAGS AND/OR RANGES MODIFIED BY DEMOGRAPHIC UPDATE ON 10/31 AT 0044 QA FLAGS AND/OR RANGES MODIFIED BY DEMOGRAPHIC UPDATE ON 10/31 AT 0052    ALT 51 (H) 0 - 44 U/L    Comment: QA FLAGS AND/OR RANGES MODIFIED BY  DEMOGRAPHIC UPDATE ON 10/31 AT 0021 QA FLAGS AND/OR RANGES MODIFIED BY DEMOGRAPHIC UPDATE ON 10/31 AT 0043 QA FLAGS AND/OR RANGES MODIFIED BY DEMOGRAPHIC UPDATE ON 10/31 AT 0043 QA FLAGS AND/OR RANGES MODIFIED BY DEMOGRAPHIC UPDATE ON 10/31 AT 0044 QA FLAGS AND/OR RANGES MODIFIED BY DEMOGRAPHIC UPDATE ON 10/31 AT 0052    Alkaline Phosphatase 35 (L) 38 - 126 U/L    Comment: QA FLAGS AND/OR RANGES MODIFIED BY DEMOGRAPHIC UPDATE ON 10/31 AT 0021 QA FLAGS AND/OR RANGES MODIFIED BY DEMOGRAPHIC UPDATE ON 10/31 AT 0043 QA FLAGS AND/OR RANGES MODIFIED BY DEMOGRAPHIC UPDATE ON 10/31 AT 0043 QA FLAGS AND/OR RANGES MODIFIED BY DEMOGRAPHIC UPDATE ON 10/31 AT 0044 QA FLAGS AND/OR RANGES MODIFIED BY DEMOGRAPHIC UPDATE ON 10/31  AT 0052    Total Bilirubin 0.6 0.3 - 1.2 mg/dL    Comment: QA FLAGS AND/OR RANGES MODIFIED BY DEMOGRAPHIC UPDATE ON 10/31 AT 0021 QA FLAGS AND/OR RANGES MODIFIED BY DEMOGRAPHIC UPDATE ON 10/31 AT 0043 QA FLAGS AND/OR RANGES MODIFIED BY DEMOGRAPHIC UPDATE ON 10/31 AT 0043 QA FLAGS AND/OR RANGES MODIFIED BY DEMOGRAPHIC UPDATE ON 10/31 AT 0044 QA FLAGS AND/OR RANGES MODIFIED BY DEMOGRAPHIC UPDATE ON 10/31 AT 0052    GFR, Estimated NOT CALCULATED >60 mL/min    Comment: QA FLAGS AND/OR RANGES MODIFIED BY DEMOGRAPHIC UPDATE ON 10/31 AT 0021 QA FLAGS AND/OR RANGES MODIFIED BY DEMOGRAPHIC UPDATE ON 10/31 AT 0043 QA FLAGS AND/OR RANGES MODIFIED BY DEMOGRAPHIC UPDATE ON 10/31 AT 0043 QA FLAGS AND/OR RANGES MODIFIED BY DEMOGRAPHIC UPDATE ON 10/31 AT 0044 QA FLAGS AND/OR RANGES MODIFIED BY DEMOGRAPHIC UPDATE ON 10/31 AT 0052 (NOTE) Calculated using the CKD-EPI Creatinine Equation (2021)    Anion gap 13 5 - 15    Comment: QA FLAGS AND/OR RANGES MODIFIED BY DEMOGRAPHIC UPDATE ON 10/31 AT 0021 QA FLAGS AND/OR RANGES MODIFIED BY DEMOGRAPHIC UPDATE ON 10/31 AT 0043 QA FLAGS AND/OR RANGES MODIFIED BY DEMOGRAPHIC UPDATE ON 10/31 AT 0043 QA FLAGS AND/OR RANGES MODIFIED BY DEMOGRAPHIC UPDATE ON  10/31 AT 0044 QA FLAGS AND/OR RANGES MODIFIED BY DEMOGRAPHIC UPDATE ON 10/31 AT 9678 Performed at [facility], [address], [place], [place] [address]   CBC     Status: Abnormal   Collection Time: 03/06/20 11:09 PM  Result Value Ref Range   WBC 14.4 (H) 4.0 - 10.5 K/uL    Comment: QA FLAGS AND/OR RANGES MODIFIED BY DEMOGRAPHIC UPDATE ON 10/31 AT 0008 QA FLAGS AND/OR RANGES MODIFIED BY DEMOGRAPHIC UPDATE ON 10/31 AT 0021 QA FLAGS AND/OR RANGES MODIFIED BY DEMOGRAPHIC UPDATE ON 10/31 AT 0043 QA FLAGS AND/OR RANGES MODIFIED BY DEMOGRAPHIC UPDATE ON 10/31 AT 0043 QA FLAGS AND/OR RANGES MODIFIED BY DEMOGRAPHIC UPDATE ON 10/31 AT 0044 QA FLAGS AND/OR RANGES MODIFIED BY DEMOGRAPHIC UPDATE ON 10/31 AT 0052    RBC 5.20 4.22 - 5.81 MIL/uL    Comment: QA FLAGS AND/OR RANGES MODIFIED BY DEMOGRAPHIC UPDATE ON 10/31 AT 0008 QA FLAGS AND/OR RANGES MODIFIED BY DEMOGRAPHIC UPDATE ON 10/31 AT 0021 QA FLAGS AND/OR RANGES MODIFIED BY DEMOGRAPHIC UPDATE ON 10/31 AT 0043 QA FLAGS AND/OR RANGES MODIFIED BY DEMOGRAPHIC UPDATE ON 10/31 AT 0043 QA FLAGS AND/OR RANGES MODIFIED BY DEMOGRAPHIC UPDATE ON 10/31 AT 0044 QA FLAGS AND/OR RANGES MODIFIED BY DEMOGRAPHIC UPDATE ON 10/31 AT 0052    Hemoglobin 15.4 13.0 - 17.0 g/dL    Comment: QA FLAGS AND/OR RANGES MODIFIED BY DEMOGRAPHIC UPDATE ON 10/31 AT 0008 QA FLAGS AND/OR RANGES MODIFIED BY DEMOGRAPHIC UPDATE ON 10/31 AT 0021 QA FLAGS AND/OR RANGES MODIFIED BY DEMOGRAPHIC UPDATE ON 10/31 AT 0043 QA FLAGS AND/OR RANGES MODIFIED BY DEMOGRAPHIC UPDATE ON 10/31 AT 0043 QA FLAGS AND/OR RANGES MODIFIED BY DEMOGRAPHIC UPDATE ON 10/31 AT 0044 QA FLAGS AND/OR RANGES MODIFIED BY DEMOGRAPHIC UPDATE ON 10/31 AT 0052    HCT 46.2 39 - 52 %    Comment: QA FLAGS AND/OR RANGES MODIFIED BY DEMOGRAPHIC UPDATE ON 10/31 AT 0008 QA FLAGS AND/OR RANGES MODIFIED BY DEMOGRAPHIC UPDATE ON 10/31 AT 0021 QA FLAGS AND/OR RANGES MODIFIED BY DEMOGRAPHIC UPDATE ON 10/31 AT 0043 QA  FLAGS AND/OR RANGES MODIFIED BY DEMOGRAPHIC UPDATE ON 10/31 AT 0043 QA FLAGS AND/OR RANGES MODIFIED BY DEMOGRAPHIC UPDATE ON 10/31 AT 0044 QA FLAGS AND/OR RANGES MODIFIED BY  DEMOGRAPHIC UPDATE ON 10/31 AT 0052    MCV 88.8 80.0 - 100.0 fL    Comment: QA FLAGS AND/OR RANGES MODIFIED BY DEMOGRAPHIC UPDATE ON 10/31 AT 0008 QA FLAGS AND/OR RANGES MODIFIED BY DEMOGRAPHIC UPDATE ON 10/31 AT 0021 QA FLAGS AND/OR RANGES MODIFIED BY DEMOGRAPHIC UPDATE ON 10/31 AT 0043 QA FLAGS AND/OR RANGES MODIFIED BY DEMOGRAPHIC UPDATE ON 10/31 AT 0043 QA FLAGS AND/OR RANGES MODIFIED BY DEMOGRAPHIC UPDATE ON 10/31 AT 0044 QA FLAGS AND/OR RANGES MODIFIED BY DEMOGRAPHIC UPDATE ON 10/31 AT 0052    MCH 29.6 26.0 - 34.0 pg    Comment: QA FLAGS AND/OR RANGES MODIFIED BY DEMOGRAPHIC UPDATE ON 10/31 AT 0008 QA FLAGS AND/OR RANGES MODIFIED BY DEMOGRAPHIC UPDATE ON 10/31 AT 0021 QA FLAGS AND/OR RANGES MODIFIED BY DEMOGRAPHIC UPDATE ON 10/31 AT 0043 QA FLAGS AND/OR RANGES MODIFIED BY DEMOGRAPHIC UPDATE ON 10/31 AT 0043 QA FLAGS AND/OR RANGES MODIFIED BY DEMOGRAPHIC UPDATE ON 10/31 AT 0044 QA FLAGS AND/OR RANGES MODIFIED BY DEMOGRAPHIC UPDATE ON 10/31 AT 0052    MCHC 33.3 30.0 - 36.0 g/dL    Comment: QA FLAGS AND/OR RANGES MODIFIED BY DEMOGRAPHIC UPDATE ON 10/31 AT 0008 QA FLAGS AND/OR RANGES MODIFIED BY DEMOGRAPHIC UPDATE ON 10/31 AT 0021 QA FLAGS AND/OR RANGES MODIFIED BY DEMOGRAPHIC UPDATE ON 10/31 AT 0043 QA FLAGS AND/OR RANGES MODIFIED BY DEMOGRAPHIC UPDATE ON 10/31 AT 0043 QA FLAGS AND/OR RANGES MODIFIED BY DEMOGRAPHIC UPDATE ON 10/31 AT 0044 QA FLAGS AND/OR RANGES MODIFIED BY DEMOGRAPHIC UPDATE ON 10/31 AT 0052    RDW 12.6 11.5 - 15.5 %    Comment: QA FLAGS AND/OR RANGES MODIFIED BY DEMOGRAPHIC UPDATE ON 10/31 AT 0008 QA FLAGS AND/OR RANGES MODIFIED BY DEMOGRAPHIC UPDATE ON 10/31 AT 0021 QA FLAGS AND/OR RANGES MODIFIED BY DEMOGRAPHIC UPDATE ON 10/31 AT 0043 QA FLAGS AND/OR RANGES MODIFIED BY DEMOGRAPHIC UPDATE ON  10/31 AT 0043 QA FLAGS AND/OR RANGES MODIFIED BY DEMOGRAPHIC UPDATE ON 10/31 AT 0044 QA FLAGS AND/OR RANGES MODIFIED BY DEMOGRAPHIC UPDATE ON 10/31 AT 0052    Platelets 262 150 - 400 K/uL    Comment: QA FLAGS AND/OR RANGES MODIFIED BY DEMOGRAPHIC UPDATE ON 10/31 AT 0008 QA FLAGS AND/OR RANGES MODIFIED BY DEMOGRAPHIC UPDATE ON 10/31 AT 0021 QA FLAGS AND/OR RANGES MODIFIED BY DEMOGRAPHIC UPDATE ON 10/31 AT 0043 QA FLAGS AND/OR RANGES MODIFIED BY DEMOGRAPHIC UPDATE ON 10/31 AT 0043 QA FLAGS AND/OR RANGES MODIFIED BY DEMOGRAPHIC UPDATE ON 10/31 AT 0044 QA FLAGS AND/OR RANGES MODIFIED BY DEMOGRAPHIC UPDATE ON 10/31 AT 0052    nRBC 0.0 0.0 - 0.2 %    Comment: QA FLAGS AND/OR RANGES MODIFIED BY DEMOGRAPHIC UPDATE ON 10/31 AT 0008 QA FLAGS AND/OR RANGES MODIFIED BY DEMOGRAPHIC UPDATE ON 10/31 AT 0021 QA FLAGS AND/OR RANGES MODIFIED BY DEMOGRAPHIC UPDATE ON 10/31 AT 0043 QA FLAGS AND/OR RANGES MODIFIED BY DEMOGRAPHIC UPDATE ON 10/31 AT 0043 QA FLAGS AND/OR RANGES MODIFIED BY DEMOGRAPHIC UPDATE ON 10/31 AT 0044 QA FLAGS AND/OR RANGES MODIFIED BY DEMOGRAPHIC UPDATE ON 10/31 AT 1025 Performed at [facility], [address] [place], [place] [address]   Ethanol     Status: Abnormal   Collection Time: 03/06/20 11:09 PM  Result Value Ref Range   Alcohol, Ethyl (B) 43 (H) <10 mg/dL    Comment: QA FLAGS AND/OR RANGES MODIFIED BY DEMOGRAPHIC UPDATE ON 10/31 AT 0008 QA FLAGS AND/OR RANGES MODIFIED BY DEMOGRAPHIC UPDATE ON 10/31 AT 0021 QA FLAGS AND/OR RANGES MODIFIED BY DEMOGRAPHIC UPDATE ON 10/31 AT 8242  QA FLAGS AND/OR RANGES MODIFIED BY DEMOGRAPHIC UPDATE ON 10/31 AT 0043 QA FLAGS AND/OR RANGES MODIFIED BY DEMOGRAPHIC UPDATE ON 10/31 AT 0044 QA FLAGS AND/OR RANGES MODIFIED BY DEMOGRAPHIC UPDATE ON 10/31 AT 0052 (NOTE) Lowest detectable limit for serum alcohol is 10 mg/dL.  For medical purposes only. Performed at [facility], [address], [place], [place] [address]   Protime-INR      Status: None   Collection Time: 03/06/20 11:09 PM  Result Value Ref Range   Prothrombin Time 12.9 11.4 - 15.2 seconds    Comment: QA FLAGS AND/OR RANGES MODIFIED BY DEMOGRAPHIC UPDATE ON 10/31 AT 0008 QA FLAGS AND/OR RANGES MODIFIED BY DEMOGRAPHIC UPDATE ON 10/31 AT 0021 QA FLAGS AND/OR RANGES MODIFIED BY DEMOGRAPHIC UPDATE ON 10/31 AT 0043 QA FLAGS AND/OR RANGES MODIFIED BY DEMOGRAPHIC UPDATE ON 10/31 AT 0043 QA FLAGS AND/OR RANGES MODIFIED BY DEMOGRAPHIC UPDATE ON 10/31 AT 0044 QA FLAGS AND/OR RANGES MODIFIED BY DEMOGRAPHIC UPDATE ON 10/31 AT 0052    INR 1.0 0.8 - 1.2    Comment: QA FLAGS AND/OR RANGES MODIFIED BY DEMOGRAPHIC UPDATE ON 10/31 AT 0008 QA FLAGS AND/OR RANGES MODIFIED BY DEMOGRAPHIC UPDATE ON 10/31 AT 0021 QA FLAGS AND/OR RANGES MODIFIED BY DEMOGRAPHIC UPDATE ON 10/31 AT 0043 QA FLAGS AND/OR RANGES MODIFIED BY DEMOGRAPHIC UPDATE ON 10/31 AT 0043 QA FLAGS AND/OR RANGES MODIFIED BY DEMOGRAPHIC UPDATE ON 10/31 AT 0044 QA FLAGS AND/OR RANGES MODIFIED BY DEMOGRAPHIC UPDATE ON 10/31 AT 0052 (NOTE) INR goal varies based on device and disease states. Performed at [facility], [address], [place], [place] [address]   Sample to Blood Bank     Status: None   Collection Time: 03/06/20 11:09 PM  Result Value Ref Range   Blood Bank Specimen SAMPLE AVAILABLE FOR TESTING    Sample Expiration      03/07/2020,2359 Performed at [facility], [address], [place], [place] [address]   Lactic acid, plasma     Status: Abnormal   Collection Time: 03/06/20 11:09 PM  Result Value Ref Range   Lactic Acid, Venous 2.7 (HH) 0.5 - 1.9 mmol/L    Comment: CRITICAL RESULT CALLED TO, READ BACK BY AND VERIFIED WITH: Holly Bodily RN [id number] M GARRETT QA FLAGS AND/OR RANGES MODIFIED BY DEMOGRAPHIC UPDATE ON 10/31 AT 0021 QA FLAGS AND/OR RANGES MODIFIED BY DEMOGRAPHIC UPDATE ON 10/31 AT 0043 QA FLAGS AND/OR RANGES MODIFIED BY DEMOGRAPHIC UPDATE ON 10/31 AT 0043 QA FLAGS  AND/OR RANGES MODIFIED BY DEMOGRAPHIC UPDATE ON 10/31 AT 0044 QA FLAGS AND/OR RANGES MODIFIED BY DEMOGRAPHIC UPDATE ON 10/31 AT 9562 Performed at [facility], [address] [place], [place] [address]   I-Stat Chem 8, ED     Status: Abnormal   Collection Time: 03/06/20 11:22 PM  Result Value Ref Range   Sodium 141 135 - 145 mmol/L    Comment: QA FLAGS AND/OR RANGES MODIFIED BY DEMOGRAPHIC UPDATE ON 10/31 AT 0008 QA FLAGS AND/OR RANGES MODIFIED BY DEMOGRAPHIC UPDATE ON 10/31 AT 0021 QA FLAGS AND/OR RANGES MODIFIED BY DEMOGRAPHIC UPDATE ON 10/31 AT 0043 QA FLAGS AND/OR RANGES MODIFIED BY DEMOGRAPHIC UPDATE ON 10/31 AT 0043 QA FLAGS AND/OR RANGES MODIFIED BY DEMOGRAPHIC UPDATE ON 10/31 AT 0044 QA FLAGS AND/OR RANGES MODIFIED BY DEMOGRAPHIC UPDATE ON 10/31 AT 0052    Potassium 3.0 (L) 3.5 - 5.1 mmol/L    Comment: QA FLAGS AND/OR RANGES MODIFIED BY DEMOGRAPHIC UPDATE ON 10/31 AT 0008 QA FLAGS  AND/OR RANGES MODIFIED BY DEMOGRAPHIC UPDATE ON 10/31 AT 0021 QA FLAGS AND/OR RANGES MODIFIED BY DEMOGRAPHIC UPDATE ON 10/31 AT 0043 QA FLAGS AND/OR RANGES MODIFIED BY DEMOGRAPHIC UPDATE ON 10/31 AT 0043 QA FLAGS AND/OR RANGES MODIFIED BY DEMOGRAPHIC UPDATE ON 10/31 AT 0044 QA FLAGS AND/OR RANGES MODIFIED BY DEMOGRAPHIC UPDATE ON 10/31 AT 0052    Chloride 104 98 - 111 mmol/L    Comment: QA FLAGS AND/OR RANGES MODIFIED BY DEMOGRAPHIC UPDATE ON 10/31 AT 0008 QA FLAGS AND/OR RANGES MODIFIED BY DEMOGRAPHIC UPDATE ON 10/31 AT 0021 QA FLAGS AND/OR RANGES MODIFIED BY DEMOGRAPHIC UPDATE ON 10/31 AT 0043 QA FLAGS AND/OR RANGES MODIFIED BY DEMOGRAPHIC UPDATE ON 10/31 AT 0043 QA FLAGS AND/OR RANGES MODIFIED BY DEMOGRAPHIC UPDATE ON 10/31 AT 0044 QA FLAGS AND/OR RANGES MODIFIED BY DEMOGRAPHIC UPDATE ON 10/31 AT 0052    BUN 17 6 - 20 mg/dL    Comment: QA FLAGS AND/OR RANGES MODIFIED BY DEMOGRAPHIC UPDATE ON 10/30 AT 2355 QA FLAGS AND/OR RANGES MODIFIED BY DEMOGRAPHIC UPDATE ON 10/31 AT 0008 QA FLAGS  AND/OR RANGES MODIFIED BY DEMOGRAPHIC UPDATE ON 10/31 AT 0021 QA FLAGS AND/OR RANGES MODIFIED BY DEMOGRAPHIC UPDATE ON 10/31 AT 0043 QA FLAGS AND/OR RANGES MODIFIED BY DEMOGRAPHIC UPDATE ON 10/31 AT 0043 QA FLAGS AND/OR RANGES MODIFIED BY DEMOGRAPHIC UPDATE ON 10/31 AT 0044 QA FLAGS AND/OR RANGES MODIFIED BY DEMOGRAPHIC UPDATE ON 10/31 AT 0052    Creatinine, Ser 1.50 (H) 0.61 - 1.24 mg/dL    Comment: QA FLAGS AND/OR RANGES MODIFIED BY DEMOGRAPHIC UPDATE ON 10/31 AT 0008 QA FLAGS AND/OR RANGES MODIFIED BY DEMOGRAPHIC UPDATE ON 10/31 AT 0021 QA FLAGS AND/OR RANGES MODIFIED BY DEMOGRAPHIC UPDATE ON 10/31 AT 0043 QA FLAGS AND/OR RANGES MODIFIED BY DEMOGRAPHIC UPDATE ON 10/31 AT 0043 QA FLAGS AND/OR RANGES MODIFIED BY DEMOGRAPHIC UPDATE ON 10/31 AT 0044 QA FLAGS AND/OR RANGES MODIFIED BY DEMOGRAPHIC UPDATE ON 10/31 AT 0052    Glucose, Bld 89 70 - 99 mg/dL    Comment: Glucose reference range applies only to samples taken after fasting for at least 8 hours. QA FLAGS AND/OR RANGES MODIFIED BY DEMOGRAPHIC UPDATE ON 10/31 AT 0008 QA FLAGS AND/OR RANGES MODIFIED BY DEMOGRAPHIC UPDATE ON 10/31 AT 0021 QA FLAGS AND/OR RANGES MODIFIED BY DEMOGRAPHIC UPDATE ON 10/31 AT 0043 QA FLAGS AND/OR RANGES MODIFIED BY DEMOGRAPHIC UPDATE ON 10/31 AT 0043 QA FLAGS AND/OR RANGES MODIFIED BY DEMOGRAPHIC UPDATE ON 10/31 AT 0044 QA FLAGS AND/OR RANGES MODIFIED BY DEMOGRAPHIC UPDATE ON 10/31 AT 0052    Calcium, Ion 1.17 1.15 - 1.40 mmol/L    Comment: QA FLAGS AND/OR RANGES MODIFIED BY DEMOGRAPHIC UPDATE ON 10/31 AT 0008 QA FLAGS AND/OR RANGES MODIFIED BY DEMOGRAPHIC UPDATE ON 10/31 AT 0021 QA FLAGS AND/OR RANGES MODIFIED BY DEMOGRAPHIC UPDATE ON 10/31 AT 0043 QA FLAGS AND/OR RANGES MODIFIED BY DEMOGRAPHIC UPDATE ON 10/31 AT 0043 QA FLAGS AND/OR RANGES MODIFIED BY DEMOGRAPHIC UPDATE ON 10/31 AT 0044 QA FLAGS AND/OR RANGES MODIFIED BY DEMOGRAPHIC UPDATE ON 10/31 AT 0052    TCO2 24 22 - 32 mmol/L    Comment: QA FLAGS  AND/OR RANGES MODIFIED BY DEMOGRAPHIC UPDATE ON 10/31 AT 0008 QA FLAGS AND/OR RANGES MODIFIED BY DEMOGRAPHIC UPDATE ON 10/31 AT 0021 QA FLAGS AND/OR RANGES MODIFIED BY DEMOGRAPHIC UPDATE ON 10/31 AT 0043 QA FLAGS AND/OR RANGES MODIFIED BY DEMOGRAPHIC UPDATE ON 10/31 AT 0043 QA FLAGS AND/OR RANGES MODIFIED BY DEMOGRAPHIC UPDATE ON 10/31 AT 0044 QA FLAGS AND/OR RANGES MODIFIED BY DEMOGRAPHIC UPDATE ON 10/31 AT  0052    Hemoglobin 15.3 13.0 - 17.0 g/dL    Comment: QA FLAGS AND/OR RANGES MODIFIED BY DEMOGRAPHIC UPDATE ON 10/31 AT 0008 QA FLAGS AND/OR RANGES MODIFIED BY DEMOGRAPHIC UPDATE ON 10/31 AT 0021 QA FLAGS AND/OR RANGES MODIFIED BY DEMOGRAPHIC UPDATE ON 10/31 AT 0043 QA FLAGS AND/OR RANGES MODIFIED BY DEMOGRAPHIC UPDATE ON 10/31 AT 0043 QA FLAGS AND/OR RANGES MODIFIED BY DEMOGRAPHIC UPDATE ON 10/31 AT 0044 QA FLAGS AND/OR RANGES MODIFIED BY DEMOGRAPHIC UPDATE ON 10/31 AT 0052    HCT 45.0 39 - 52 %    Comment: QA FLAGS AND/OR RANGES MODIFIED BY DEMOGRAPHIC UPDATE ON 10/31 AT 0008 QA FLAGS AND/OR RANGES MODIFIED BY DEMOGRAPHIC UPDATE ON 10/31 AT 0021 QA FLAGS AND/OR RANGES MODIFIED BY DEMOGRAPHIC UPDATE ON 10/31 AT 0043 QA FLAGS AND/OR RANGES MODIFIED BY DEMOGRAPHIC UPDATE ON 10/31 AT 0043 QA FLAGS AND/OR RANGES MODIFIED BY DEMOGRAPHIC UPDATE ON 10/31 AT 0044 QA FLAGS AND/OR RANGES MODIFIED BY DEMOGRAPHIC UPDATE ON 10/31 AT [id number]   Respiratory Panel by RT PCR (Flu A&B, Covid) - Nasopharyngeal Swab     Status: None   Collection Time: 03/06/20 11:38 PM   Specimen: Nasopharyngeal Swab  Result Value Ref Range   SARS Coronavirus 2 by RT PCR NEGATIVE NEGATIVE    Comment: (NOTE) SARS-CoV-2 target nucleic acids are NOT DETECTED.  The SARS-CoV-2 RNA is generally detectable in upper respiratoy specimens during the acute phase of infection. The lowest concentration of SARS-CoV-2 viral copies this assay can detect is 131 copies/mL. A negative result does not preclude SARS-Cov-2 infection and  should not be used as the sole basis for treatment or other patient management decisions. A negative result may occur with  improper specimen collection/handling, submission of specimen other than nasopharyngeal swab, presence of viral mutation(s) within the areas targeted by this assay, and inadequate number of viral copies (<131 copies/mL). A negative result must be combined with clinical observations, patient history, and epidemiological information. The expected result is Negative.  Fact Sheet for Patients:  https://www.moore.com/https://www.fda.gov/media/142436/download  Fact Sheet for Healthcare Providers:  https://www.young.biz/https://www.fda.gov/media/142435/download  This test is no t yet approved or cleared by the [place] FDA and  has been authorized for detection and/or diagnosis of SARS-CoV-2 by FDA under an Emergency Use Authorization (EUA). This EUA will remain  in effect (meaning this test can be used) for the duration of the COVID-19 declaration under Section 564(b)(1) of the Act, 21 U.S.C. section 360bbb-3(b)(1), unless the authorization is terminated or revoked sooner.     Influenza A by PCR NEGATIVE NEGATIVE   Influenza B by PCR NEGATIVE NEGATIVE    Comment: (NOTE) The Xpert Xpress SARS-CoV-2/FLU/RSV assay is intended as an aid in  the diagnosis of influenza from Nasopharyngeal swab specimens and  should not be used as a sole basis for treatment. Nasal washings and  aspirates are unacceptable for Xpert Xpress SARS-CoV-2/FLU/RSV  testing.  Fact Sheet for Patients: https://www.moore.com/https://www.fda.gov/media/142436/download  Fact Sheet for Healthcare Providers: https://www.young.biz/https://www.fda.gov/media/142435/download  This test is not yet approved or cleared by the [place] FDA and  has been authorized for detection and/or diagnosis of SARS-CoV-2 by  FDA under an Emergency Use Authorization (EUA). This EUA will remain  in effect (meaning this test can be used) for the duration of the  Covid-19 declaration under Section  564(b)(1) of the Act, 21  U.S.C. section 360bbb-3(b)(1), unless the authorization is  terminated or revoked. Performed at [facility], [address] [place], [place] [id number]   CBC  Status: Abnormal   Collection Time: 03/07/20  3:51 PM  Result Value Ref Range   WBC 11.0 (H) 4.0 - 10.5 K/uL   RBC 4.58 4.22 - 5.81 MIL/uL   Hemoglobin 13.6 13.0 - 17.0 g/dL   HCT 09.8 39 - 52 %   MCV 88.2 80.0 - 100.0 fL   MCH 29.7 26.0 - 34.0 pg   MCHC 33.7 30.0 - 36.0 g/dL   RDW 11.9 14.7 - 82.9 %   Platelets 186 150 - 400 K/uL   nRBC 0.0 0.0 - 0.2 %    Comment: Performed at [facility], [address] [address] [place], [place] [address]  Basic metabolic panel     Status: Abnormal   Collection Time: 03/07/20  3:51 PM  Result Value Ref Range   Sodium 136 135 - 145 mmol/L   Potassium 3.9 3.5 - 5.1 mmol/L    Comment: NO VISIBLE HEMOLYSIS DELTA CHECK NOTED    Chloride 100 98 - 111 mmol/L   CO2 24 22 - 32 mmol/L   Glucose, Bld 160 (H) 70 - 99 mg/dL    Comment: Glucose reference range applies only to samples taken after fasting for at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

## 2020-03-08 NOTE — TOC CAGE-AID Note (Signed)
Transition of Care [facility]) - CAGE-AID Screening   Patient Details  Name: Elijah Johnson MRN: [id number] Date of Birth: 03/16/1976  [facility] CM/SW Contact:    Emeterio Reeve, [place] Phone Number: 03/08/2020, 1:00 PM   Clinical Narrative:  CSW met with pt at bedside. CSW introduced self and explained role at the hospital.  Pt reports occasional alcohol use of 1-2x a month. Pt denies substance use. Pt did not need any resources at this time.    CAGE-AID Screening:    Have You Ever Felt You Ought to Cut Down on Your Drinking or Drug Use?: No Have People Annoyed You By Critizing Your Drinking Or Drug Use?: No Have You Felt Bad Or Guilty About Your Drinking Or Drug Use?: No Have You Ever Had a Drink or Used Drugs First Thing In The Morning to Steady Your Nerves or to Get Rid of a Hangover?: No CAGE-AID Score: 0  Substance Abuse Education Offered: Yes  Substance abuse interventions: Patient Counseling   Emeterio Reeve, Latanya Presser, [place] Social Worker [phone]
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
## 2020-03-09 ENCOUNTER — Inpatient Hospital Stay (HOSPITAL_COMMUNITY)

## 2020-03-09 LAB — BASIC METABOLIC PANEL
Anion gap: 7 (ref 5–15)
BUN: 9 mg/dL (ref 6–20)
CO2: 27 mmol/L (ref 22–32)
Calcium: 8.5 mg/dL — ABNORMAL LOW (ref 8.9–10.3)
Chloride: 103 mmol/L (ref 98–111)
Creatinine, Ser: 1.17 mg/dL (ref 0.61–1.24)
GFR, Estimated: >60 mL/min (ref >60)
Glucose, Bld: 118 mg/dL — ABNORMAL HIGH (ref 70–99)
Potassium: 4 mmol/L (ref 3.5–5.1)
Sodium: 137 mmol/L (ref 135–145)

## 2020-03-09 LAB — CBC
HCT: 34.3 % — ABNORMAL LOW (ref 39.0–52.0)
Hemoglobin: 11.2 g/dL — ABNORMAL LOW (ref 13.0–17.0)
MCH: 29.6 pg (ref 26.0–34.0)
MCHC: 32.7 g/dL (ref 30.0–36.0)
MCV: 90.7 fL (ref 80.0–100.0)
Platelets: 146 10*3/uL — ABNORMAL LOW (ref 150–400)
RBC: 3.78 MIL/uL — ABNORMAL LOW (ref 4.22–5.81)
RDW: 12.9 % (ref 11.5–15.5)
WBC: 7.5 10*3/uL (ref 4.0–10.5)
nRBC: 0 % (ref 0.0–0.2)

## 2020-03-09 MED ORDER — POLYETHYLENE GLYCOL 3350 17 G PO PACK
17.0000 g | PACK | Freq: Two times a day (BID) | ORAL | Status: DC
  Administered 2020-03-09 – 2020-03-10 (×3): 17 g via ORAL
  Filled 2020-03-09 (×3): qty 1

## 2020-03-09 MED ORDER — KETOROLAC TROMETHAMINE 30 MG/ML IJ SOLN
30.0000 mg | Freq: Three times a day (TID) | INTRAMUSCULAR | Status: DC | PRN
  Administered 2020-03-09: 30 mg via INTRAVENOUS
  Filled 2020-03-09: qty 1

## 2020-03-09 MED ORDER — KETOROLAC TROMETHAMINE 10 MG PO TABS
10.0000 mg | ORAL_TABLET | Freq: Four times a day (QID) | ORAL | Status: DC
  Administered 2020-03-09 – 2020-03-10 (×4): 10 mg via ORAL
  Filled 2020-03-09 (×6): qty 1

## 2020-03-09 MED ORDER — TRAMADOL HCL 50 MG PO TABS
50.0000 mg | ORAL_TABLET | Freq: Four times a day (QID) | ORAL | Status: DC | PRN
  Administered 2020-03-09 (×2): 50 mg via ORAL
  Filled 2020-03-09: qty 1
  Filled 2020-03-09 (×2): qty 2

## 2020-03-09 MED ORDER — KETOROLAC TROMETHAMINE 30 MG/ML IJ SOLN: 30.0000 mg | Freq: Three times a day (TID) | INTRAMUSCULAR | Status: DC | PRN

## 2020-03-09 MED ORDER — MELATONIN 5 MG PO TABS
5.0000 mg | ORAL_TABLET | Freq: Every day | ORAL | Status: DC
  Administered 2020-03-09: 5 mg via ORAL
  Filled 2020-03-09: qty 1

## 2020-03-09 MED ORDER — ZOLPIDEM TARTRATE 5 MG PO TABS: 5.0000 mg | ORAL_TABLET | Freq: Every evening | ORAL | Status: DC | PRN

## 2020-03-09 MED ORDER — VITAMIN D 25 MCG (1000 UNIT) PO TABS
2000.0000 [IU] | ORAL_TABLET | Freq: Two times a day (BID) | ORAL | Status: DC
  Administered 2020-03-09 – 2020-03-10 (×3): 2000 [IU] via ORAL
  Filled 2020-03-09 (×3): qty 2

## 2020-03-09 MED ORDER — PREGABALIN 75 MG PO CAPS
75.0000 mg | ORAL_CAPSULE | Freq: Two times a day (BID) | ORAL | Status: DC
  Administered 2020-03-09 – 2020-03-10 (×3): 75 mg via ORAL
  Filled 2020-03-09 (×3): qty 1

## 2020-03-09 NOTE — Discharge Instructions (Signed)
Pneumothorax A pneumothorax is commonly called a collapsed lung. It is a condition in which air leaks from a lung and builds up between the thin layer of tissue that covers the lungs (visceral pleura) and the interior wall of the chest cavity (parietal pleura). The air gets trapped outside the lung, between the lung and the chest wall (pleural space). The air takes up space and prevents the lung from fully expanding. This condition sometimes occurs suddenly with no apparent cause. The buildup of air may be small or large. A small pneumothorax may go away on its own. A large pneumothorax will require treatment and hospitalization. What are the causes? This condition may be caused by:  Trauma and injury to the chest wall.  Surgery and other medical procedures.  A complication of an underlying lung problem, especially chronic obstructive pulmonary disease (COPD) or emphysema. Sometimes the cause of this condition is not known. What increases the risk? You are more likely to develop this condition if:  You have an underlying lung problem.  You smoke.  You are 20-40 years old, male, tall, and underweight.  You have a personal or family history of pneumothorax.  You have an eating disorder (anorexia nervosa). This condition can also happen quickly, even in people with no history of lung problems. What are the signs or symptoms? Sometimes a pneumothorax will have no symptoms. When symptoms are present, they can include:  Chest pain.  Shortness of breath.  Increased rate of breathing.  Bluish color to your lips or skin (cyanosis). How is this diagnosed? This condition may be diagnosed by:  A medical history and physical exam.  A chest X-ray, chest CT scan, or ultrasound. How is this treated? Treatment depends on how severe your condition is. The goal of treatment is to remove the extra air and allow your lung to expand back to its normal size.  For a small pneumothorax: ? No  treatment may be needed. ? Extra oxygen is sometimes used to make it go away more quickly.  For a large pneumothorax or a pneumothorax that is causing symptoms, a procedure is done to drain the air from your lungs. To do this, a health care provider may use: ? A needle with a syringe. This is used to suck air from a pleural space where no additional leakage is taking place. ? A chest tube. This is used to suck air where there is ongoing leakage into the pleural space. The chest tube may need to remain in place for several days until the air leak has healed.  In more severe cases, surgery may be needed to repair the damage that is causing the leak.  If you have multiple pneumothorax episodes or have an air leak that will not heal, a procedure called a pleurodesis may be done. A medicine is placed in the pleural space to irritate the tissues around the lung so that the lung will stick to the chest wall, seal any leaks, and stop any buildup of air in that space. If you have an underlying lung problem, severe symptoms, or a large pneumothorax you will usually need to stay in the hospital. Follow these instructions at home: Lifestyle  Do not use any products that contain nicotine or tobacco, such as cigarettes and e-cigarettes. These are major risk factors in pneumothorax. If you need help quitting, ask your health care provider.  Do not lift anything that is heavier than 10 lb (4.5 kg), or the limit that your health care   provider tells you, until he or she says that it is safe.  Avoid activities that take a lot of effort (strenuous) for as long as told by your health care provider.  Return to your normal activities as told by your health care provider. Ask your health care provider what activities are safe for you.  Do not fly in an airplane or scuba dive until your health care provider says it is okay. General instructions  Take over-the-counter and prescription medicines only as told by your  health care provider.  If a cough or pain makes it difficult for you to sleep at night, try sleeping in a semi-upright position in a recliner or by using 2 or 3 pillows.  If you had a chest tube and it was removed, ask your health care provider when you can remove the bandage (dressing). While the dressing is in place, do not allow it to get wet.  Keep all follow-up visits as told by your health care provider. This is important. Contact a health care provider if:  You cough up thick mucus (sputum) that is yellow or green in color.  You were treated with a chest tube, and you have redness, increasing pain, or discharge at the site where it was placed. Get help right away if:  You have increasing chest pain or shortness of breath.  You have a cough that will not go away.  You begin coughing up blood.  You have pain that is getting worse or is not controlled with medicines.  The site where your chest tube was located opens up.  You feel air coming out of the site where the chest tube was placed.  You have a fever or persistent symptoms for more than 2-3 days.  You have a fever and your symptoms suddenly get worse. These symptoms may represent a serious problem that is an emergency. Do not wait to see if the symptoms will go away. Get medical help right away. Call your local emergency services (911 in the [place] Do not drive yourself to the hospital. Summary  A pneumothorax, commonly called a collapsed lung, is a condition in which air leaks from a lung and gets trapped between the lung and the chest wall (pleural space).  The buildup of air may be small or large. A small pneumothorax may go away on its own. A large pneumothorax will require treatment and hospitalization.  Treatment for this condition depends on how severe the pneumothorax is. The goal of treatment is to remove the extra air and allow the lung to expand back to its normal size. This information is not intended to  replace advice given to you by your health care provider. Make sure you discuss any questions you have with your health care provider. Document Revised: 04/06/2017 Document Reviewed: 04/02/2017 Elsevier Patient Education  2020 [employer].    Orthopaedic Trauma Service Discharge Instructions   General Discharge Instructions  WEIGHT BEARING STATUS: Non-weightbearing right upper extremity  RANGE OF MOTION/ACTIVITY: Ok for unrestricted shoulder motion as tolerated  Wound Care: Incisions can be left open to air if there is no drainage. Okay to shower if no drainage from incisions. Do NOT apply any ointments, solutions or lotions to pin sites or surgical wounds  Diet: as you were eating previously.  Can use over the counter stool softeners and bowel preparations, such as Miralax, to help with bowel movements.  Narcotics can be constipating.  Be sure to drink plenty of fluids  PAIN MEDICATION  USE AND EXPECTATIONS  You have likely been given narcotic medications to help control your pain.  After a traumatic event that results in an fracture (broken bone) with or without surgery, it is ok to use narcotic pain medications to help control one's pain.  We understand that everyone responds to pain differently and each individual patient will be evaluated on a regular basis for the continued need for narcotic medications. Ideally, narcotic medication use should last no more than 6-8 weeks (coinciding with fracture healing).   As a patient it is your responsibility as well to monitor narcotic medication use and report the amount and frequency you use these medications when you come to your office visit.   We would also advise that if you are using narcotic medications, you should take a dose prior to therapy to maximize you participation.  IF YOU ARE ON NARCOTIC MEDICATIONS IT IS NOT PERMISSIBLE TO OPERATE A MOTOR VEHICLE (MOTORCYCLE/CAR/TRUCK/MOPED) OR HEAVY MACHINERY DO NOT MIX NARCOTICS WITH OTHER  CNS (CENTRAL NERVOUS SYSTEM) DEPRESSANTS SUCH AS ALCOHOL   STOP SMOKING OR USING NICOTINE PRODUCTS!!!!  As discussed nicotine severely impairs your body's ability to heal surgical and traumatic wounds but also impairs bone healing.  Wounds and bone heal by forming microscopic blood vessels (angiogenesis) and nicotine is a vasoconstrictor (essentially, shrinks blood vessels).  Therefore, if vasoconstriction occurs to these microscopic blood vessels they essentially disappear and are unable to deliver necessary nutrients to the healing tissue.  This is one modifiable factor that you can do to dramatically increase your chances of healing your injury.    (This means no smoking, no nicotine gum, patches, etc)    ICE AND ELEVATE INJURED/OPERATIVE EXTREMITY  Using ice and elevating the injured extremity above your heart can help with swelling and pain control.  Icing in a pulsatile fashion, such as 20 minutes on and 20 minutes off, can be followed.    Do not place ice directly on skin. Make sure there is a barrier between to skin and the ice pack.    Using frozen items such as frozen peas works well as the conform nicely to the are that needs to be iced.  USE AN ACE WRAP OR TED HOSE FOR SWELLING CONTROL  In addition to icing and elevation, Ace wraps or TED hose are used to help limit and resolve swelling.  It is recommended to use Ace wraps or TED hose until you are informed to stop.    When using Ace Wraps start the wrapping distally (farthest away from the body) and wrap proximally (closer to the body)   Example: If you had surgery on your leg or thing and you do not have a splint on, start the ace wrap at the toes and work your way up to the thigh        If you had surgery on your upper extremity and do not have a splint on, start the ace wrap at your fingers and work your way up to the upper arm    CALL THE OFFICE WITH ANY QUESTIONS OR CONCERNS: [phone]   VISIT OUR WEBSITE FOR ADDITIONAL  INFORMATION: [email or web address]
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
## 2020-03-10 ENCOUNTER — Other Ambulatory Visit (HOSPITAL_COMMUNITY): Payer: Self-pay | Admitting: General Surgery

## 2020-03-10 LAB — BASIC METABOLIC PANEL
Anion gap: 11 (ref 5–15)
BUN: 8 mg/dL (ref 6–20)
CO2: 25 mmol/L (ref 22–32)
Calcium: 8.8 mg/dL — ABNORMAL LOW (ref 8.9–10.3)
Chloride: 99 mmol/L (ref 98–111)
Creatinine, Ser: 1.04 mg/dL (ref 0.61–1.24)
GFR, Estimated: >60 mL/min (ref >60)
Glucose, Bld: 139 mg/dL — ABNORMAL HIGH (ref 70–99)
Potassium: 3.7 mmol/L (ref 3.5–5.1)
Sodium: 135 mmol/L (ref 135–145)

## 2020-03-10 LAB — CBC
HCT: 34.7 % — ABNORMAL LOW (ref 39.0–52.0)
Hemoglobin: 11.5 g/dL — ABNORMAL LOW (ref 13.0–17.0)
MCH: 29.6 pg (ref 26.0–34.0)
MCHC: 33.1 g/dL (ref 30.0–36.0)
MCV: 89.2 fL (ref 80.0–100.0)
Platelets: 157 10*3/uL (ref 150–400)
RBC: 3.89 MIL/uL — ABNORMAL LOW (ref 4.22–5.81)
RDW: 12.6 % (ref 11.5–15.5)
WBC: 5.6 10*3/uL (ref 4.0–10.5)
nRBC: 0 % (ref 0.0–0.2)

## 2020-03-10 MED ORDER — KETOROLAC TROMETHAMINE 10 MG PO TABS
10.0000 mg | ORAL_TABLET | Freq: Four times a day (QID) | ORAL | 0 refills | Status: DC | PRN
Start: 2020-03-10 — End: 2020-03-10

## 2020-03-10 MED ORDER — ACETAMINOPHEN 500 MG PO TABS: 1000.0000 mg | ORAL_TABLET | Freq: Four times a day (QID) | ORAL | 0 refills | Status: DC | PRN

## 2020-03-10 MED ORDER — METHOCARBAMOL 500 MG PO TABS: 1000.0000 mg | ORAL_TABLET | Freq: Three times a day (TID) | ORAL | 0 refills | Status: DC

## 2020-03-10 MED ORDER — OXYCODONE HCL 5 MG PO TABS
10.0000 mg | ORAL_TABLET | Freq: Once | ORAL | Status: AC
  Administered 2020-03-10: 10 mg via ORAL
  Filled 2020-03-10: qty 2

## 2020-03-10 MED ORDER — MELATONIN 5 MG PO TABS
5.0000 mg | ORAL_TABLET | Freq: Every day | ORAL | 0 refills | Status: DC
Start: 2020-03-10 — End: 2020-05-27

## 2020-03-10 MED ORDER — DOCUSATE SODIUM 100 MG PO CAPS: 100.0000 mg | ORAL_CAPSULE | Freq: Two times a day (BID) | ORAL | 0 refills | Status: DC

## 2020-03-10 MED ORDER — POLYETHYLENE GLYCOL 3350 17 G PO PACK: 17.0000 g | PACK | Freq: Two times a day (BID) | ORAL | 0 refills | Status: DC | PRN

## 2020-03-10 MED ORDER — PREGABALIN 75 MG PO CAPS: 75.0000 mg | ORAL_CAPSULE | Freq: Two times a day (BID) | ORAL | 0 refills | Status: DC

## 2020-03-10 MED ORDER — OXYCODONE HCL 10 MG PO TABS: 10.0000 mg | ORAL_TABLET | Freq: Four times a day (QID) | ORAL | 0 refills | Status: DC | PRN

## 2020-03-10 MED ORDER — VITAMIN D3 25 MCG PO TABS: 2000.0000 [IU] | ORAL_TABLET | Freq: Two times a day (BID) | ORAL | 0 refills | Status: AC

## 2020-03-10 MED FILL — PREGABALIN 75 MG CAPS: 75 | 10 days supply | Qty: 20 | Fill #0

## 2020-03-10 MED FILL — METHOCARBAMOL 500 MG TABS: 500 | 10 days supply | Qty: 60 | Fill #0

## 2020-03-10 MED FILL — oxyCODONE HCL 10 MG TABS: 10 | 7 days supply | Qty: 28 | Fill #0

## 2020-03-10 MED FILL — KETOROLAC 10 MG TABLET: 10 | 5 days supply | Qty: 20 | Fill #0
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
## 2020-03-11 ENCOUNTER — Encounter: Payer: Self-pay | Admitting: Orthopaedic Surgery

## 2020-03-31 ENCOUNTER — Telehealth: Payer: Self-pay | Admitting: Orthopaedic Surgery

## 2020-03-31 ENCOUNTER — Other Ambulatory Visit: Payer: Self-pay | Admitting: Physician Assistant

## 2020-03-31 ENCOUNTER — Other Ambulatory Visit (HOSPITAL_COMMUNITY): Payer: Self-pay | Admitting: Sports Medicine

## 2020-03-31 ENCOUNTER — Other Ambulatory Visit: Payer: Self-pay | Admitting: Sports Medicine

## 2020-03-31 ENCOUNTER — Ambulatory Visit
Admission: RE | Admit: 2020-03-31 | Discharge: 2020-03-31 | Disposition: A | Discharge status: Home / Self Care | Source: Ambulatory Visit | Attending: Physician Assistant | Admitting: Physician Assistant

## 2020-03-31 DIAGNOSIS — M25562 Pain in left knee: Secondary | ICD-10-CM

## 2020-03-31 DIAGNOSIS — J939 Pneumothorax, unspecified: Secondary | ICD-10-CM
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
## 2020-03-31 NOTE — Telephone Encounter (Signed)
FYI

## 2020-04-05 ENCOUNTER — Ambulatory Visit (INDEPENDENT_AMBULATORY_CARE_PROVIDER_SITE_OTHER): Admitting: Orthopaedic Surgery

## 2020-04-05 ENCOUNTER — Encounter: Payer: Self-pay | Admitting: Orthopaedic Surgery

## 2020-04-05 DIAGNOSIS — M25462 Effusion, left knee: Secondary | ICD-10-CM
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
## 2020-04-08 ENCOUNTER — Ambulatory Visit (HOSPITAL_COMMUNITY)

## 2020-04-09 ENCOUNTER — Other Ambulatory Visit: Payer: Self-pay

## 2020-04-09 ENCOUNTER — Ambulatory Visit (HOSPITAL_COMMUNITY)
Admission: RE | Admit: 2020-04-09 | Discharge: 2020-04-09 | Disposition: A | Discharge status: Home / Self Care | Source: Ambulatory Visit | Attending: Sports Medicine | Admitting: Sports Medicine

## 2020-04-09 DIAGNOSIS — M25562 Pain in left knee: Secondary | ICD-10-CM | POA: Insufficient documentation

## 2020-04-12 ENCOUNTER — Ambulatory Visit (INDEPENDENT_AMBULATORY_CARE_PROVIDER_SITE_OTHER): Admitting: Orthopaedic Surgery

## 2020-04-12 ENCOUNTER — Encounter: Payer: Self-pay | Admitting: Orthopaedic Surgery

## 2020-04-12 DIAGNOSIS — S83512D Sprain of anterior cruciate ligament of left knee, subsequent encounter: Secondary | ICD-10-CM
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
## 2020-04-14 ENCOUNTER — Encounter: Payer: Self-pay | Admitting: Orthopedic Surgery

## 2020-04-14 ENCOUNTER — Ambulatory Visit (INDEPENDENT_AMBULATORY_CARE_PROVIDER_SITE_OTHER): Admitting: Orthopedic Surgery

## 2020-04-14 VITALS — Ht 75.0 in | Wt 205.0 lb

## 2020-04-14 DIAGNOSIS — S83512D Sprain of anterior cruciate ligament of left knee, subsequent encounter: Secondary | ICD-10-CM

## 2020-04-16 ENCOUNTER — Telehealth: Payer: Self-pay

## 2020-04-16 NOTE — Telephone Encounter (Signed)
Please advise. What type of brace?

## 2020-04-16 NOTE — Telephone Encounter (Signed)
Patient called he is requesting a rx for a left knee brace to be faxed to [facility]. CB:[phone]

## 2020-04-17 ENCOUNTER — Encounter: Payer: Self-pay | Admitting: Orthopedic Surgery
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
## 2020-04-19 NOTE — Telephone Encounter (Signed)
Bledsoe brace

## 2020-04-20 ENCOUNTER — Encounter: Payer: Self-pay | Admitting: Orthopedic Surgery

## 2020-04-20 DIAGNOSIS — S83512D Sprain of anterior cruciate ligament of left knee, subsequent encounter: Secondary | ICD-10-CM

## 2020-04-20 NOTE — Telephone Encounter (Signed)
Notified patient via mychart message that referral was faxed

## 2020-05-27 ENCOUNTER — Other Ambulatory Visit: Payer: Self-pay

## 2020-05-27 ENCOUNTER — Other Ambulatory Visit (HOSPITAL_COMMUNITY)

## 2020-05-27 ENCOUNTER — Encounter (HOSPITAL_BASED_OUTPATIENT_CLINIC_OR_DEPARTMENT_OTHER): Payer: Self-pay | Admitting: Orthopedic Surgery

## 2020-05-31 ENCOUNTER — Ambulatory Visit (HOSPITAL_BASED_OUTPATIENT_CLINIC_OR_DEPARTMENT_OTHER): Admit: 2020-05-31 | Admitting: Orthopedic Surgery

## 2020-05-31 ENCOUNTER — Encounter (HOSPITAL_BASED_OUTPATIENT_CLINIC_OR_DEPARTMENT_OTHER): Payer: Self-pay

## 2020-05-31 ENCOUNTER — Encounter (HOSPITAL_BASED_OUTPATIENT_CLINIC_OR_DEPARTMENT_OTHER)
Admission: RE | Admit: 2020-05-31 | Discharge: 2020-05-31 | Disposition: A | Discharge status: Home / Self Care | Source: Ambulatory Visit | Attending: Orthopedic Surgery | Admitting: Orthopedic Surgery

## 2020-05-31 ENCOUNTER — Other Ambulatory Visit (HOSPITAL_COMMUNITY)
Admission: RE | Admit: 2020-05-31 | Discharge: 2020-05-31 | Disposition: A | Discharge status: Home / Self Care | Source: Ambulatory Visit | Attending: Orthopedic Surgery | Admitting: Orthopedic Surgery

## 2020-05-31 DIAGNOSIS — Z01812 Encounter for preprocedural laboratory examination: Secondary | ICD-10-CM | POA: Diagnosis present

## 2020-05-31 DIAGNOSIS — Z79899 Other long term (current) drug therapy: Secondary | ICD-10-CM | POA: Diagnosis not present

## 2020-05-31 DIAGNOSIS — Z01810 Encounter for preprocedural cardiovascular examination: Secondary | ICD-10-CM | POA: Insufficient documentation

## 2020-05-31 DIAGNOSIS — Z20822 Contact with and (suspected) exposure to covid-19: Secondary | ICD-10-CM | POA: Insufficient documentation

## 2020-05-31 DIAGNOSIS — S83512A Sprain of anterior cruciate ligament of left knee, initial encounter: Secondary | ICD-10-CM | POA: Diagnosis present

## 2020-05-31 DIAGNOSIS — S83232A Complex tear of medial meniscus, current injury, left knee, initial encounter: Secondary | ICD-10-CM | POA: Diagnosis not present

## 2020-05-31 DIAGNOSIS — M25862 Other specified joint disorders, left knee: Secondary | ICD-10-CM | POA: Diagnosis not present

## 2020-05-31 LAB — CBC
HCT: 47.1 % (ref 39.0–52.0)
Hemoglobin: 15.9 g/dL (ref 13.0–17.0)
MCH: 28.6 pg (ref 26.0–34.0)
MCHC: 33.8 g/dL (ref 30.0–36.0)
MCV: 84.9 fL (ref 80.0–100.0)
Platelets: 230 10*3/uL (ref 150–400)
RBC: 5.55 MIL/uL (ref 4.22–5.81)
RDW: 12.2 % (ref 11.5–15.5)
WBC: 5.9 10*3/uL (ref 4.0–10.5)
nRBC: 0 % (ref 0.0–0.2)

## 2020-05-31 LAB — BASIC METABOLIC PANEL
Anion gap: 12 (ref 5–15)
BUN: 14 mg/dL (ref 6–20)
CO2: 23 mmol/L (ref 22–32)
Calcium: 9.8 mg/dL (ref 8.9–10.3)
Chloride: 103 mmol/L (ref 98–111)
Creatinine, Ser: 1.25 mg/dL — ABNORMAL HIGH (ref 0.61–1.24)
GFR, Estimated: >60 mL/min (ref >60)
Glucose, Bld: 95 mg/dL (ref 70–99)
Potassium: 4 mmol/L (ref 3.5–5.1)
Sodium: 138 mmol/L (ref 135–145)

## 2020-05-31 LAB — SARS CORONAVIRUS 2 (TAT 6-24 HRS): SARS Coronavirus 2: NEGATIVE

## 2020-05-31 SURGERY — RECONSTRUCTION, KNEE, ACL
Anesthesia: General | Site: Knee | Laterality: Left

## 2020-05-31 NOTE — Progress Notes (Signed)

## 2020-06-01 ENCOUNTER — Encounter (HOSPITAL_BASED_OUTPATIENT_CLINIC_OR_DEPARTMENT_OTHER)
Admission: RE | Disposition: A | Discharge status: Home / Self Care | Payer: Self-pay | Source: Home / Self Care | Attending: Orthopedic Surgery

## 2020-06-01 ENCOUNTER — Encounter (HOSPITAL_BASED_OUTPATIENT_CLINIC_OR_DEPARTMENT_OTHER): Payer: Self-pay | Admitting: Orthopedic Surgery

## 2020-06-01 ENCOUNTER — Ambulatory Visit (HOSPITAL_COMMUNITY)
Admission: RE | Admit: 2020-06-01 | Discharge: 2020-06-01 | Disposition: A | Discharge status: Home / Self Care | Attending: Orthopedic Surgery | Admitting: Orthopedic Surgery

## 2020-06-01 ENCOUNTER — Other Ambulatory Visit: Payer: Self-pay

## 2020-06-01 ENCOUNTER — Other Ambulatory Visit (HOSPITAL_BASED_OUTPATIENT_CLINIC_OR_DEPARTMENT_OTHER): Payer: Self-pay | Admitting: Surgical

## 2020-06-01 ENCOUNTER — Ambulatory Visit (HOSPITAL_BASED_OUTPATIENT_CLINIC_OR_DEPARTMENT_OTHER): Admitting: Anesthesiology

## 2020-06-01 DIAGNOSIS — S83242D Other tear of medial meniscus, current injury, left knee, subsequent encounter: Secondary | ICD-10-CM

## 2020-06-01 DIAGNOSIS — S83512A Sprain of anterior cruciate ligament of left knee, initial encounter: Secondary | ICD-10-CM | POA: Diagnosis not present

## 2020-06-01 DIAGNOSIS — S83512D Sprain of anterior cruciate ligament of left knee, subsequent encounter: Secondary | ICD-10-CM

## 2020-06-01 DIAGNOSIS — M25862 Other specified joint disorders, left knee: Secondary | ICD-10-CM | POA: Insufficient documentation

## 2020-06-01 DIAGNOSIS — Z79899 Other long term (current) drug therapy: Secondary | ICD-10-CM | POA: Insufficient documentation

## 2020-06-01 DIAGNOSIS — S83232A Complex tear of medial meniscus, current injury, left knee, initial encounter: Secondary | ICD-10-CM | POA: Insufficient documentation

## 2020-06-01 HISTORY — PX: ANTERIOR CRUCIATE LIGAMENT REPAIR: SHX115

## 2020-06-01 HISTORY — DX: Other specified postprocedural states: Z98.890

## 2020-06-01 HISTORY — DX: Essential (primary) hypertension: I10

## 2020-06-01 HISTORY — DX: Other specified postprocedural states: R11.2

## 2020-06-01 SURGERY — RECONSTRUCTION, KNEE, ACL
Anesthesia: General | Site: Knee | Laterality: Left

## 2020-06-01 MED ORDER — HYDROMORPHONE HCL 1 MG/ML IJ SOLN
INTRAMUSCULAR | Status: AC
  Filled 2020-06-01: qty 0.5

## 2020-06-01 MED ORDER — BUPIVACAINE HCL (PF) 0.25 % IJ SOLN
INTRAMUSCULAR | Status: AC
  Filled 2020-06-01: qty 30

## 2020-06-01 MED ORDER — FENTANYL CITRATE (PF) 100 MCG/2ML IJ SOLN
50.0000 ug | Freq: Once | INTRAMUSCULAR | Status: AC
  Administered 2020-06-01: 100 ug via INTRAVENOUS

## 2020-06-01 MED ORDER — VANCOMYCIN HCL 500 MG IV SOLR
INTRAVENOUS | Status: DC | PRN
  Administered 2020-06-01: 500 mg

## 2020-06-01 MED ORDER — PROPOFOL 10 MG/ML IV BOLUS
INTRAVENOUS | Status: AC
  Filled 2020-06-01: qty 20

## 2020-06-01 MED ORDER — OXYCODONE HCL 5 MG PO TABS
5.0000 mg | ORAL_TABLET | Freq: Once | ORAL | Status: AC
  Administered 2020-06-01: 5 mg via ORAL

## 2020-06-01 MED ORDER — FENTANYL CITRATE (PF) 100 MCG/2ML IJ SOLN
INTRAMUSCULAR | Status: AC
  Filled 2020-06-01: qty 2

## 2020-06-01 MED ORDER — DEXAMETHASONE SODIUM PHOSPHATE 10 MG/ML IJ SOLN
INTRAMUSCULAR | Status: AC
  Filled 2020-06-01: qty 1

## 2020-06-01 MED ORDER — POVIDONE-IODINE 10 % EX SWAB: 2.0000 "application " | Freq: Once | CUTANEOUS | Status: DC

## 2020-06-01 MED ORDER — DEXMEDETOMIDINE (PRECEDEX) IN NS 20 MCG/5ML (4 MCG/ML) IV SYRINGE
PREFILLED_SYRINGE | INTRAVENOUS | Status: AC
  Filled 2020-06-01: qty 5

## 2020-06-01 MED ORDER — EPINEPHRINE PF 1 MG/ML IJ SOLN
INTRAMUSCULAR | Status: AC
  Filled 2020-06-01: qty 4

## 2020-06-01 MED ORDER — CELECOXIB 200 MG PO CAPS: 200.0000 mg | ORAL_CAPSULE | Freq: Once | ORAL | Status: DC

## 2020-06-01 MED ORDER — CLONIDINE HCL (ANALGESIA) 100 MCG/ML EP SOLN
EPIDURAL | Status: AC
  Filled 2020-06-01: qty 10

## 2020-06-01 MED ORDER — ACETAMINOPHEN 500 MG PO TABS
1000.0000 mg | ORAL_TABLET | Freq: Once | ORAL | Status: AC
  Administered 2020-06-01: 1000 mg via ORAL

## 2020-06-01 MED ORDER — KETOROLAC TROMETHAMINE 30 MG/ML IJ SOLN
INTRAMUSCULAR | Status: AC
  Filled 2020-06-01: qty 1

## 2020-06-01 MED ORDER — LIDOCAINE HCL (CARDIAC) PF 100 MG/5ML IV SOSY
PREFILLED_SYRINGE | INTRAVENOUS | Status: DC | PRN
  Administered 2020-06-01: 40 mg via INTRATRACHEAL

## 2020-06-01 MED ORDER — BUPIVACAINE-EPINEPHRINE 0.25% -1:200000 IJ SOLN
INTRAMUSCULAR | Status: DC | PRN
  Administered 2020-06-01: 20 mL

## 2020-06-01 MED ORDER — KETOROLAC TROMETHAMINE 30 MG/ML IJ SOLN
30.0000 mg | Freq: Once | INTRAMUSCULAR | Status: AC
  Administered 2020-06-01: 30 mg via INTRAVENOUS

## 2020-06-01 MED ORDER — CEFAZOLIN SODIUM-DEXTROSE 2-4 GM/100ML-% IV SOLN
INTRAVENOUS | Status: AC
  Filled 2020-06-01: qty 100

## 2020-06-01 MED ORDER — ONDANSETRON HCL 4 MG/2ML IJ SOLN
INTRAMUSCULAR | Status: AC
  Filled 2020-06-01: qty 2

## 2020-06-01 MED ORDER — DEXAMETHASONE SODIUM PHOSPHATE 10 MG/ML IJ SOLN
INTRAMUSCULAR | Status: DC | PRN
  Administered 2020-06-01: 4 mg via INTRAVENOUS

## 2020-06-01 MED ORDER — VANCOMYCIN HCL 500 MG IV SOLR
INTRAVENOUS | Status: AC
  Filled 2020-06-01: qty 500

## 2020-06-01 MED ORDER — BUPIVACAINE-EPINEPHRINE (PF) 0.5% -1:200000 IJ SOLN
INTRAMUSCULAR | Status: DC | PRN
  Administered 2020-06-01: 20 mL via PERINEURAL

## 2020-06-01 MED ORDER — PROPOFOL 500 MG/50ML IV EMUL
INTRAVENOUS | Status: DC | PRN
  Administered 2020-06-01: 25 ug/kg/min via INTRAVENOUS

## 2020-06-01 MED ORDER — ACETAMINOPHEN 500 MG PO TABS
ORAL_TABLET | ORAL | Status: AC
  Filled 2020-06-01: qty 2

## 2020-06-01 MED ORDER — PROPOFOL 10 MG/ML IV BOLUS
INTRAVENOUS | Status: DC | PRN
  Administered 2020-06-01: 200 mg via INTRAVENOUS

## 2020-06-01 MED ORDER — DEXMEDETOMIDINE (PRECEDEX) IN NS 20 MCG/5ML (4 MCG/ML) IV SYRINGE
PREFILLED_SYRINGE | INTRAVENOUS | Status: DC | PRN
  Administered 2020-06-01: 4 ug via INTRAVENOUS
  Administered 2020-06-01: 8 ug via INTRAVENOUS

## 2020-06-01 MED ORDER — METHYLPREDNISOLONE ACETATE 40 MG/ML IJ SUSP
INTRAMUSCULAR | Status: AC
  Filled 2020-06-01: qty 1

## 2020-06-01 MED ORDER — FENTANYL CITRATE (PF) 100 MCG/2ML IJ SOLN
INTRAMUSCULAR | Status: DC | PRN
  Administered 2020-06-01 (×2): 25 ug via INTRAVENOUS
  Administered 2020-06-01: 50 ug via INTRAVENOUS

## 2020-06-01 MED ORDER — MORPHINE SULFATE (PF) 4 MG/ML IV SOLN
INTRAVENOUS | Status: AC
  Filled 2020-06-01: qty 2

## 2020-06-01 MED ORDER — BUPIVACAINE-EPINEPHRINE (PF) 0.25% -1:200000 IJ SOLN
INTRAMUSCULAR | Status: AC
  Filled 2020-06-01: qty 30

## 2020-06-01 MED ORDER — MIDAZOLAM HCL 2 MG/2ML IJ SOLN
INTRAMUSCULAR | Status: AC
  Filled 2020-06-01: qty 2

## 2020-06-01 MED ORDER — SODIUM CHLORIDE 0.9 % IR SOLN
Status: DC | PRN
  Administered 2020-06-01: 12000 mL

## 2020-06-01 MED ORDER — OXYCODONE-ACETAMINOPHEN 5-325 MG PO TABS: 1.0000 | ORAL_TABLET | ORAL | 0 refills | Status: DC | PRN

## 2020-06-01 MED ORDER — OXYCODONE HCL 5 MG PO TABS
ORAL_TABLET | ORAL | Status: AC
  Filled 2020-06-01: qty 1

## 2020-06-01 MED ORDER — CELECOXIB 200 MG PO CAPS
ORAL_CAPSULE | ORAL | Status: AC
  Filled 2020-06-01: qty 1

## 2020-06-01 MED ORDER — LIDOCAINE 2% (20 MG/ML) 5 ML SYRINGE
INTRAMUSCULAR | Status: AC
  Filled 2020-06-01: qty 5

## 2020-06-01 MED ORDER — POVIDONE-IODINE 10 % EX SWAB
2.0000 "application " | Freq: Once | CUTANEOUS | Status: AC
  Administered 2020-06-01: 2 via TOPICAL

## 2020-06-01 MED ORDER — BUPIVACAINE HCL (PF) 0.25 % IJ SOLN
INTRAMUSCULAR | Status: DC | PRN
  Administered 2020-06-01: 30 mL

## 2020-06-01 MED ORDER — MIDAZOLAM HCL 2 MG/2ML IJ SOLN
1.0000 mg | Freq: Once | INTRAMUSCULAR | Status: AC
  Administered 2020-06-01: 2 mg via INTRAVENOUS

## 2020-06-01 MED ORDER — LACTATED RINGERS IV SOLN: INTRAVENOUS | Status: DC

## 2020-06-01 MED ORDER — ONDANSETRON HCL 4 MG/2ML IJ SOLN
INTRAMUSCULAR | Status: DC | PRN
  Administered 2020-06-01: 4 mg via INTRAVENOUS

## 2020-06-01 MED ORDER — HYDROMORPHONE HCL 1 MG/ML IJ SOLN
0.2500 mg | INTRAMUSCULAR | Status: DC | PRN
Start: 2020-06-01 — End: 2020-06-01
  Administered 2020-06-01 (×4): 0.5 mg via INTRAVENOUS

## 2020-06-01 MED ORDER — MORPHINE SULFATE (PF) 4 MG/ML IV SOLN
INTRAVENOUS | Status: DC | PRN
  Administered 2020-06-01: 8 mg via INTRAVENOUS

## 2020-06-01 MED ORDER — CEFAZOLIN SODIUM-DEXTROSE 2-4 GM/100ML-% IV SOLN
2.0000 g | INTRAVENOUS | Status: AC
  Administered 2020-06-01: 2 g via INTRAVENOUS

## 2020-06-01 MED ORDER — RIVAROXABAN 10 MG PO TABS: 10.0000 mg | ORAL_TABLET | Freq: Every day | ORAL | 0 refills | Status: DC

## 2020-06-01 MED ORDER — POVIDONE-IODINE 7.5 % EX SOLN
Freq: Once | CUTANEOUS | Status: DC
  Filled 2020-06-01: qty 118

## 2020-06-01 MED ORDER — CLONIDINE HCL (ANALGESIA) 100 MCG/ML EP SOLN
EPIDURAL | Status: DC | PRN
  Administered 2020-06-01: 1 mL

## 2020-06-01 MED ORDER — METHYLPREDNISOLONE ACETATE 80 MG/ML IJ SUSP
INTRAMUSCULAR | Status: AC
  Filled 2020-06-01: qty 1

## 2020-06-01 MED ORDER — PROPOFOL 500 MG/50ML IV EMUL
INTRAVENOUS | Status: AC
  Filled 2020-06-01: qty 50

## 2020-06-01 MED FILL — XARELTO 10 MG TABLET: 10 | 15 days supply | Qty: 15 | Fill #0

## 2020-06-01 MED FILL — OXYCODONE-APAP 5-325MG: 5-325 | 5 days supply | Qty: 30 | Fill #0

## 2020-06-01 SURGICAL SUPPLY — 109 items
BANDAGE ESMARK 6X9 LF (GAUZE/BANDAGES/DRESSINGS) IMPLANT
BLADE EXCALIBUR 4.0X13 (MISCELLANEOUS) IMPLANT
BLADE SHAVER TORPEDO 4X13 (MISCELLANEOUS) IMPLANT
BLADE SURG 10 STRL SS (BLADE) ×1 IMPLANT
BLADE SURG 15 STRL LF DISP TIS (BLADE) ×1 IMPLANT
BLADE SURG 15 STRL SS (BLADE) ×4
BNDG CMPR 9X6 STRL LF SNTH (GAUZE/BANDAGES/DRESSINGS) ×1
BNDG ELASTIC 4X5.8 VLCR STR LF (GAUZE/BANDAGES/DRESSINGS) ×4 IMPLANT
BNDG ELASTIC 6X5.8 VLCR STR LF (GAUZE/BANDAGES/DRESSINGS) ×2 IMPLANT
BNDG ESMARK 6X9 LF (GAUZE/BANDAGES/DRESSINGS) ×2
BURR OVAL 8 FLU 4.0X13 (MISCELLANEOUS) ×2 IMPLANT
COOLER ICEMAN CLASSIC (MISCELLANEOUS) ×2 IMPLANT
COVER BACK TABLE 60X90IN (DRAPES) ×2 IMPLANT
COVER WAND RF STERILE (DRAPES) IMPLANT
CUFF TOURN SGL QUICK 34 (TOURNIQUET CUFF) ×2
CUFF TRNQT CYL 34X4.125X (TOURNIQUET CUFF) IMPLANT
DISSECTOR  3.8MM X 13CM (MISCELLANEOUS)
DISSECTOR 3.8MM X 13CM (MISCELLANEOUS) IMPLANT
DISSECTOR 4.0MM X 13CM (MISCELLANEOUS) ×2 IMPLANT
DRAPE ARTHROSCOPY W/POUCH 90 (DRAPES) ×2 IMPLANT
DRAPE IMP U-DRAPE 54X76 (DRAPES) ×1 IMPLANT
DRAPE INCISE IOBAN 66X45 STRL (DRAPES) ×2 IMPLANT
DRAPE OEC MINIVIEW 54X84 (DRAPES) ×2 IMPLANT
DRAPE U-SHAPE 47X51 STRL (DRAPES) ×1 IMPLANT
DRILL FLIPCUTTER III 6-12 (ORTHOPEDIC DISPOSABLE SUPPLIES) ×1 IMPLANT
DRSG PAD ABDOMINAL 8X10 ST (GAUZE/BANDAGES/DRESSINGS) IMPLANT
DRSG TEGADERM 4X4.75 (GAUZE/BANDAGES/DRESSINGS) ×11 IMPLANT
DURAPREP 26ML APPLICATOR (WOUND CARE) ×2 IMPLANT
DW OUTFLOW CASSETTE/TUBE SET (MISCELLANEOUS) ×2 IMPLANT
ELECT REM PT RETURN 9FT ADLT (ELECTROSURGICAL) ×2
ELECTRODE REM PT RTRN 9FT ADLT (ELECTROSURGICAL) IMPLANT
EXCALIBUR 3.8MM X 13CM (MISCELLANEOUS) IMPLANT
FLIPCUTTER III 6-12 AR-1204FF (ORTHOPEDIC DISPOSABLE SUPPLIES) ×2
GAUZE SPONGE 4X4 12PLY STRL (GAUZE/BANDAGES/DRESSINGS) ×2 IMPLANT
GAUZE XEROFORM 1X8 LF (GAUZE/BANDAGES/DRESSINGS) ×4 IMPLANT
GLOVE BIO SURGEON STRL SZ7 (GLOVE) ×1 IMPLANT
GLOVE ECLIPSE 6.5 STRL STRAW (GLOVE) ×3 IMPLANT
GLOVE ECLIPSE 8.0 STRL XLNG CF (GLOVE) ×2 IMPLANT
GLOVE SRG 8 PF TXTR STRL LF DI (GLOVE) ×1 IMPLANT
GLOVE SURG MICRO LTX SZ7 (GLOVE) ×1 IMPLANT
GLOVE SURG UNDER POLY LF SZ7 (GLOVE) ×2 IMPLANT
GLOVE SURG UNDER POLY LF SZ8 (GLOVE) ×2
GOWN STRL REUS W/ TWL LRG LVL3 (GOWN DISPOSABLE) ×2 IMPLANT
GOWN STRL REUS W/ TWL XL LVL3 (GOWN DISPOSABLE) ×1 IMPLANT
GOWN STRL REUS W/TWL LRG LVL3 (GOWN DISPOSABLE) ×6
GOWN STRL REUS W/TWL XL LVL3 (GOWN DISPOSABLE) ×2
GRAFT TISS SEMITEND 4-8 (Bone Implant) IMPLANT
HARVESTER TENDON QUADPRO 11 (ORTHOPEDIC DISPOSABLE SUPPLIES) ×1 IMPLANT
IMMOBILIZER KNEE 22 UNIV (SOFTGOODS) IMPLANT
IMMOBILIZER KNEE 24 THIGH 36 (MISCELLANEOUS) IMPLANT
IMMOBILIZER KNEE 24 UNIV (MISCELLANEOUS) ×2
IMP SYS 2ND FIX PEEK 4.75X19.1 (Miscellaneous) ×2 IMPLANT
IMPL SYS 2ND FX PEEK 4.75X19.1 (Miscellaneous) IMPLANT
IMPL TIGHTROP ABS ACL FIBERTG (Orthopedic Implant) IMPLANT
IMPL TIGHTROP FIBERTAG ACL (Orthopedic Implant) IMPLANT
IMPL TIGHTROPE ABS ACL FIBERTG (Orthopedic Implant) ×2 IMPLANT
IMPLANT TIGHTROPE FIBERTAG ACL (Orthopedic Implant) ×2 IMPLANT
IV NS IRRIG 3000ML ARTHROMATIC (IV SOLUTION) ×6 IMPLANT
KIT BIOCARTILAGE LG JOINT MIX (KITS) ×2 IMPLANT
MANIFOLD NEPTUNE II (INSTRUMENTS) ×2 IMPLANT
NDL HYPO 18GX1.5 BLUNT FILL (NEEDLE) ×1 IMPLANT
NDL SAFETY ECLIPSE 18X1.5 (NEEDLE) ×2 IMPLANT
NDL SPNL 18GX3.5 QUINCKE PK (NEEDLE) IMPLANT
NEEDLE HYPO 18GX1.5 BLUNT FILL (NEEDLE) ×2 IMPLANT
NEEDLE HYPO 18GX1.5 SHARP (NEEDLE) ×4
NEEDLE SPNL 18GX3.5 QUINCKE PK (NEEDLE) IMPLANT
PACK ARTHROSCOPY DSU (CUSTOM PROCEDURE TRAY) ×2 IMPLANT
PACK BASIN DAY SURGERY FS (CUSTOM PROCEDURE TRAY) ×2 IMPLANT
PAD CAST 4YDX4 CTTN HI CHSV (CAST SUPPLIES) ×1 IMPLANT
PAD COLD SHLDR WRAP-ON (PAD) ×2 IMPLANT
PADDING CAST COTTON 4X4 STRL (CAST SUPPLIES) ×2
PADDING CAST COTTON 6X4 STRL (CAST SUPPLIES) ×2 IMPLANT
PENCIL SMOKE EVACUATOR (MISCELLANEOUS) ×1 IMPLANT
PK GRAFTLINK AUTO IMPLANT SYST (Anchor) ×2 IMPLANT
PORT APPOLLO RF 90DEGREE MULTI (SURGICAL WAND) ×2 IMPLANT
PUTTY DBM ALLOSYNC PURE 5CC (Putty) ×2 IMPLANT
SLEEVE SCD COMPRESS KNEE MED (MISCELLANEOUS) ×2 IMPLANT
SPONGE LAP 18X18 RF (DISPOSABLE) ×2 IMPLANT
SPONGE LAP 4X18 RFD (DISPOSABLE) ×2 IMPLANT
STRIP CLOSURE SKIN 1/2X4 (GAUZE/BANDAGES/DRESSINGS) ×2 IMPLANT
SUCTION FRAZIER HANDLE 10FR (MISCELLANEOUS) ×2
SUCTION TUBE FRAZIER 10FR DISP (MISCELLANEOUS) ×1 IMPLANT
SUT ETHILON 3 0 PS 1 (SUTURE) ×4 IMPLANT
SUT FIBERWIRE #2 38 T-5 BLUE (SUTURE)
SUT FIBERWIRE 2-0 18 17.9 3/8 (SUTURE) ×6
SUT MNCRL AB 3-0 PS2 18 (SUTURE) ×2 IMPLANT
SUT VIC AB 0 CT1 27 (SUTURE) ×2
SUT VIC AB 0 CT1 27XBRD ANBCTR (SUTURE) ×2 IMPLANT
SUT VIC AB 1 CT1 27 (SUTURE) ×2
SUT VIC AB 1 CT1 27XBRD ANBCTR (SUTURE) ×1 IMPLANT
SUT VIC AB 2-0 CT1 27 (SUTURE) ×2
SUT VIC AB 2-0 CT1 TAPERPNT 27 (SUTURE) ×1 IMPLANT
SUT VICRYL 0 UR6 27IN ABS (SUTURE) ×3 IMPLANT
SUTURE FIBERWR #2 38 T-5 BLUE (SUTURE) IMPLANT
SUTURE FIBERWR 2-0 18 17.9 3/8 (SUTURE) IMPLANT
SUTURE TAPE 1.3 FIBERLOP 20 ST (SUTURE) IMPLANT
SUTURE TAPE TIGERLINK 1.3MM BL (SUTURE) IMPLANT
SUTURETAPE 1.3 FIBERLOOP 20 ST (SUTURE)
SUTURETAPE TIGERLINK 1.3MM BL (SUTURE)
SYR 5ML LL (SYRINGE) ×3 IMPLANT
SYR BULB IRRIG 60ML STRL (SYRINGE) IMPLANT
SYR TB 1ML LL NO SAFETY (SYRINGE) ×2 IMPLANT
SYSTEM GRAFT IMPLANT AUTOGRAFT (Anchor) IMPLANT
TENDON SEMI-TENDINOSUS (Bone Implant) ×2 IMPLANT
TOWEL GREEN STERILE FF (TOWEL DISPOSABLE) ×4 IMPLANT
TRAY DSU PREP LF (CUSTOM PROCEDURE TRAY) ×1 IMPLANT
TUBING ARTHROSCOPY IRRIG 16FT (MISCELLANEOUS) ×2 IMPLANT
WRAP KNEE MAXI GEL POST OP (GAUZE/BANDAGES/DRESSINGS) ×1 IMPLANT
YANKAUER SUCT BULB TIP NO VENT (SUCTIONS) ×2 IMPLANT

## 2020-06-01 NOTE — Anesthesia Postprocedure Evaluation (Signed)
Anesthesia Post Note  Patient: HALLIS MEDITZ  Procedure(s) Performed: left knee anterior crucitate ligament reconstruction, meniscal debridement (Left Knee)     Patient location during evaluation: PACU Anesthesia Type: General and Regional Level of consciousness: awake and alert Pain management: pain level controlled Vital Signs Assessment: post-procedure vital signs reviewed and stable Respiratory status: spontaneous breathing, nonlabored ventilation and respiratory function stable Cardiovascular status: blood pressure returned to baseline and stable Postop Assessment: no apparent nausea or vomiting Anesthetic complications: no   No complications documented.  Last Vitals:  Vitals:   06/01/20 1515 06/01/20 1520  BP: 120/82   Pulse: 65 63  Resp: 12 18  Temp:    SpO2: 98% 96%    Last Pain:  Vitals:   06/01/20 1520  TempSrc:   PainSc: 6                  Shamel Galyean,W. EDMOND

## 2020-06-01 NOTE — Discharge Instructions (Signed)
*  You had 1000 mg of Tylenol at 10:05am *You had 5 mg of Oxycodone at 3:30pm *Do not take ibuprofen/Motrin/Advil until 11pm  Post Anesthesia Home Care Instructions  Activity: Get plenty of rest for the remainder of the day. A responsible individual must stay with you for 24 hours following the procedure.  For the next 24 hours, DO NOT: -Drive a car -Advertising copywriter -Drink alcoholic beverages -Take any medication unless instructed by your physician -Make any legal decisions or sign important papers.  Meals: Start with liquid foods such as gelatin or soup. Progress to regular foods as tolerated. Avoid greasy, spicy, heavy foods. If nausea and/or vomiting occur, drink only clear liquids until the nausea and/or vomiting subsides. Call your physician if vomiting continues.  Special Instructions/Symptoms: Your throat may feel dry or sore from the anesthesia or the breathing tube placed in your throat during surgery. If this causes discomfort, gargle with warm salt water. The discomfort should disappear within 24 hours.  If you had a scopolamine patch placed behind your ear for the management of post- operative nausea and/or vomiting:  1. The medication in the patch is effective for 72 hours, after which it should be removed.  Wrap patch in a tissue and discard in the trash. Wash hands thoroughly with soap and water. 2. You may remove the patch earlier than 72 hours if you experience unpleasant side effects which may include dry mouth, dizziness or visual disturbances. 3. Avoid touching the patch. Wash your hands with soap and water after contact with the patch.    Regional Anesthesia Blocks  1. Numbness or the inability to move the "blocked" extremity may last from 3-48 hours after placement. The length of time depends on the medication injected and your individual response to the medication. If the numbness is not going away after 48 hours, call your surgeon.  2. The extremity that is  blocked will need to be protected until the numbness is gone and the  Strength has returned. Because you cannot feel it, you will need to take extra care to avoid injury. Because it may be weak, you may have difficulty moving it or using it. You may not know what position it is in without looking at it while the block is in effect.  3. For blocks in the legs and feet, returning to weight bearing and walking needs to be done carefully. You will need to wait until the numbness is entirely gone and the strength has returned. You should be able to move your leg and foot normally before you try and bear weight or walk. You will need someone to be with you when you first try to ensure you do not fall and possibly risk injury.  4. Bruising and tenderness at the needle site are common side effects and will resolve in a few days.  5. Persistent numbness or new problems with movement should be communicated to the surgeon or the [facility] [phone] [facility] [phone]).
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
## 2020-06-01 NOTE — Transfer of Care (Signed)
Immediate Anesthesia Transfer of Care Note  Patient: Elijah Johnson  Procedure(s) Performed: left knee anterior crucitate ligament reconstruction, meniscal debridement (Left Knee)  Patient Location: PACU  Anesthesia Type:GA combined with regional for post-op pain  Level of Consciousness: drowsy  Airway & Oxygen Therapy: Patient Spontanous Breathing and Patient connected to face mask oxygen  Post-op Assessment: Report given to RN and Post -op Vital signs reviewed and stable  Post vital signs: Reviewed and stable  Last Vitals:  Vitals Value Taken Time  BP    Temp    Pulse 72 06/01/20 1454  Resp 12 06/01/20 1454  SpO2 97 % 06/01/20 1454  Vitals shown include unvalidated device data.  Last Pain:  Vitals:   06/01/20 0959  TempSrc: Oral  PainSc: 1       Patients Stated Pain Goal: 5 (06/01/20 0959)  Complications: No complications documented.

## 2020-06-01 NOTE — Brief Op Note (Signed)
   06/01/2020  3:03 PM  PATIENT:  Phill Mutter  45 y.o. male  PRE-OPERATIVE DIAGNOSIS:  left knee anterior cruciate ligament tear,medial meniscal tear , medial meniscal cyst  POST-OPERATIVE DIAGNOSIS:  left knee anterior cruciate ligament tear,medial meniscal tear medial meniscal/PCL cyst PROCEDURE:  Procedure(s): left knee anterior crucitate ligament reconstruction, meniscal debridement, meniscal/PCL cyst decompression  SURGEON:  Surgeon(s): Cammy Copa, MD  ASSISTANT: Magnant PA  ANESTHESIA:   general  EBL: 5 ml    Total I/O In: 1200 [I.V.:1100; IV Piggyback:100] Out: 25 [Blood:25]  BLOOD ADMINISTERED: none  DRAINS: none   LOCAL MEDICATIONS USED: Marcaine morphine clonidine vancomycin  SPECIMEN:  No Specimen  COUNTS:  YES  TOURNIQUET:   Total Tourniquet Time Documented: Thigh (Left) - 21 minutes Total: Thigh (Left) - 21 minutes   DICTATION: .Other Dictation: Dictation Number [id number]  PLAN OF CARE: Discharge to home after PACU  PATIENT DISPOSITION:  PACU - hemodynamically stable

## 2020-06-01 NOTE — Progress Notes (Signed)
Assisted Dr. Edmond Fitzgerald with left, ultrasound guided, adductor canal block. Side rails up, monitors on throughout procedure. See vital signs in flow sheet. Tolerated Procedure well. 
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
## 2020-06-01 NOTE — Anesthesia Preprocedure Evaluation (Addendum)
Anesthesia Evaluation  Patient identified by MRN, date of birth, ID band Patient awake    Reviewed: Allergy & Precautions, H&P , NPO status , Patient's Chart, lab work & pertinent test results  History of Anesthesia Complications (+) PONV  Airway Mallampati: I  TM Distance: >3 FB Neck ROM: Full    Dental no notable dental hx. (+) Teeth Intact, Dental Advisory Given   Pulmonary neg pulmonary ROS,    Pulmonary exam normal breath sounds clear to auscultation       Cardiovascular hypertension, Pt. on medications  Rhythm:Regular Rate:Normal     Neuro/Psych negative neurological ROS  negative psych ROS   GI/Hepatic negative GI ROS, Neg liver ROS,   Endo/Other  negative endocrine ROS  Renal/GU Renal disease  negative genitourinary   Musculoskeletal   Abdominal   Peds  Hematology negative hematology ROS (+)   Anesthesia Other Findings   Reproductive/Obstetrics negative OB ROS                            Anesthesia Physical Anesthesia Plan  ASA: II  Anesthesia Plan: General   Post-op Pain Management:  Regional for Post-op pain   Induction: Intravenous  PONV Risk Score and Plan: 4 or greater and Ondansetron, Dexamethasone, Midazolam and Propofol infusion  Airway Management Planned: LMA  Additional Equipment:   Intra-op Plan:   Post-operative Plan: Extubation in OR  Informed Consent: I have reviewed the patients History and Physical, chart, labs and discussed the procedure including the risks, benefits and alternatives for the proposed anesthesia with the patient or authorized representative who has indicated his/her understanding and acceptance.     Dental advisory given  Plan Discussed with: CRNA  Anesthesia Plan Comments:        Anesthesia Quick Evaluation

## 2020-06-01 NOTE — Anesthesia Procedure Notes (Signed)
Anesthesia Regional Block: Adductor canal block   Pre-Anesthetic Checklist: ,, timeout performed, Correct Patient, Correct Site, Correct Laterality, Correct Procedure, Correct Position, site marked, Risks and benefits discussed, pre-op evaluation,  At surgeon's request and post-op pain management  Laterality: Left  Prep: Maximum Sterile Barrier Precautions used, chloraprep       Needles:  Injection technique: Single-shot  Needle Type: Echogenic Stimulator Needle     Needle Length: 9cm  Needle Gauge: 21     Additional Needles:   Procedures:,,,, ultrasound used (permanent image in chart),,,,  Narrative:  Start time: 06/01/2020 10:11 AM End time: 06/01/2020 10:21 AM Injection made incrementally with aspirations every 5 mL.  Performed by: Personally  Anesthesiologist: Gaynelle Adu, MD  Additional Notes: 2% Lidocaine skin wheel.

## 2020-06-02 ENCOUNTER — Encounter (HOSPITAL_BASED_OUTPATIENT_CLINIC_OR_DEPARTMENT_OTHER): Payer: Self-pay | Admitting: Orthopedic Surgery

## 2020-06-03 ENCOUNTER — Encounter: Payer: Self-pay | Admitting: Orthopedic Surgery

## 2020-06-03 ENCOUNTER — Other Ambulatory Visit: Payer: Self-pay | Admitting: Surgical

## 2020-06-03 MED ORDER — OXYCODONE HCL 10 MG PO TABS
10.0000 mg | ORAL_TABLET | ORAL | 0 refills | Status: DC | PRN
Start: 2020-06-03 — End: 2020-06-09

## 2020-06-03 NOTE — Telephone Encounter (Signed)
Ill send in a prescription for 10 mg oxy without tylenol that he can take q3h. Sending to [facility] on [address].  If no better, plan to come in tomorrow morning

## 2020-06-03 NOTE — Telephone Encounter (Signed)
I can send in Gabapentin to supplement his pain medication.  Okay to take 2 pills of the 5mg  oxycodone q3h prn.  If continued severe pain, okay to come in tomorrow for potential aspiration

## 2020-06-04 ENCOUNTER — Telehealth: Payer: Self-pay

## 2020-06-04 ENCOUNTER — Encounter: Payer: Self-pay | Admitting: Surgical

## 2020-06-04 ENCOUNTER — Ambulatory Visit (INDEPENDENT_AMBULATORY_CARE_PROVIDER_SITE_OTHER): Admitting: Surgical

## 2020-06-04 DIAGNOSIS — S83512D Sprain of anterior cruciate ligament of left knee, subsequent encounter: Secondary | ICD-10-CM

## 2020-06-04 MED ORDER — LIDOCAINE HCL 1 % IJ SOLN
5.0000 mL | INTRAMUSCULAR | Status: AC | PRN
  Administered 2020-06-04: 5 mL

## 2020-06-04 NOTE — Telephone Encounter (Signed)
Kim called stating that patient is in a lot of pain in the left knee/leg and would like to know if this is to be expected?  Stated that patient had a fever last night and today temperature was 99 and not sure if he needs to be seen due to the increased pain he is having.  Cb# [phone].  Please advise.  Thank you

## 2020-06-04 NOTE — Telephone Encounter (Signed)
Scheduled him to see Franky Macho this afternoon

## 2020-06-04 NOTE — Progress Notes (Signed)
Post-Op Visit Note   Patient: Elijah Johnson           Date of Birth: [birth year]           MRN: [id number] Visit Date: 06/04/2020 PCP: Blair Heys, MD   Assessment & Plan:  Chief Complaint:  Chief Complaint  Patient presents with  . Left Knee - Routine Post Op   Visit Diagnoses:  1. Rupture of anterior cruciate ligament of left knee, subsequent encounter     Plan: Patient is a 45 year old male who is s/p left knee ACL reconstruction with quadricep autograft supplemented with semitendinosis allograft and medial meniscal debridement with meniscal cyst decompression on 06/01/2020.  Complains of moderate to severe pain.  Using CPM machine and is up to 55 degrees.  Dressings are without any sign of infection or dehiscence.  Sutures will be left in as he is only 3 days out from procedure.  Moderate effusion is present.  This effusion was aspirated of about 35 cc of bloody fluid.  No evidence of purulence.  Improved pain after aspiration.  No calf tenderness.  Negative Homans' sign.  He is compliant with taking Xarelto for DVT prophylaxis.  Plan to obtain ultrasound at his next postop appointment to rule out DVT and likely transition from Xarelto to aspirin if the ultrasound is negative.  On exam he has 5 degrees of extension and 60 degrees of knee flexion.  ACL graft is stable on Lachman exam.  Not able to perform any straight leg raises, which is not surprising at this point.  Plan to continue CPM machine and straight leg raise exercises and follow-up at his normal scheduled appointment.  Start physical therapy at that point.  Follow-Up Instructions: No follow-ups on file.   Orders:  No orders of the defined types were placed in this encounter.  No orders of the defined types were placed in this encounter.   Imaging: No results found.  PMFS History: Patient Active Problem List   Diagnosis Date Noted  . Clavicle fracture 03/07/2020  . Tear of right glenoid labrum 04/25/2013  .  Plantar fasciitis of right foot 02/11/2013  . Rotator cuff (capsule) sprain 02/11/2013  . UNSPEC DISORDERS BURSAE&TENDONS SHOULDER REGION 05/26/2009  . STREPTOCOCCAL PHARYNGITIS 10/19/2008   Past Medical History:  Diagnosis Date  . Chronic kidney disease    CKD 3 per patients wife  . Hypertension   . Labral tear of shoulder    right  . PONV (postoperative nausea and vomiting)   . Tear of left glenoid labrum 04/25/2013  . Tear of right glenoid labrum 04/25/2013    History reviewed. No pertinent family history.  Past Surgical History:  Procedure Laterality Date  . ANTERIOR CRUCIATE LIGAMENT REPAIR Left 06/01/2020   Procedure: left knee anterior crucitate ligament reconstruction, meniscal debridement;  Surgeon: Cammy Copa, MD;  Location: [facility];  Service: Orthopedics;  Laterality: Left;  . CHEST TUBE INSERTION Right 03/07/2020   Procedure: CHEST TUBE INSERTION;  Surgeon: Emelia Loron, MD;  Location: [facility] OR;  Service: General;  Laterality: Right;  . EYE SURGERY    . KNEE SURGERY Right   . left finger    . ORIF CLAVICULAR FRACTURE Right 03/07/2020   Procedure: OPEN REDUCTION INTERNAL FIXATION (ORIF) CLAVICULAR FRACTURE;  Surgeon: Roby Lofts, MD;  Location: [facility] OR;  Service: Orthopedics;  Laterality: Right;  . SCALP LACERATION REPAIR Right 03/07/2020   Procedure: SCALP LACERATION REPAIR;  Surgeon: Roby Lofts, MD;  Location:  [facility] OR;  Service: Orthopedics;  Laterality: Right;  RIGHT POSTERIOR SCALP  . SHOULDER SURGERY Right 2014   dr Dion Saucier  . SHOULDER SURGERY Left   . VASECTOMY    . WISDOM TOOTH EXTRACTION     Social History   Occupational History  . Not on file  Tobacco Use  . Smoking status: Never Smoker  . Smokeless tobacco: Never Used  Vaping Use  . Vaping Use: Never used  Substance and Sexual Activity  . Alcohol use: Not Currently    Comment: social  . Drug use: No  . Sexual activity: Not on file

## 2020-06-04 NOTE — Progress Notes (Signed)
   Procedure Note  Patient: Elijah Johnson             Date of Birth: 14-Nov-1975           MRN: [id number]             Visit Date: 06/04/2020  Procedures: Visit Diagnoses:  1. Rupture of anterior cruciate ligament of left knee, subsequent encounter     Large Joint Inj: L knee on 06/04/2020 4:23 PM Indications: diagnostic evaluation, joint swelling and pain Details: 18 G 1.5 in needle, superolateral approach  Arthrogram: No  Medications: 5 mL lidocaine 1 % Aspirate: 35 mL bloody Outcome: tolerated well, no immediate complications Procedure, treatment alternatives, risks and benefits explained, specific risks discussed. Consent was given by the patient. Immediately prior to procedure a time out was called to verify the correct patient, procedure, equipment, support staff and site/side marked as required. Patient was prepped and draped in the usual sterile fashion.

## 2020-06-09 ENCOUNTER — Encounter: Payer: Self-pay | Admitting: Orthopedic Surgery

## 2020-06-09 ENCOUNTER — Inpatient Hospital Stay: Admitting: Orthopedic Surgery

## 2020-06-09 ENCOUNTER — Ambulatory Visit (INDEPENDENT_AMBULATORY_CARE_PROVIDER_SITE_OTHER): Admitting: Orthopedic Surgery

## 2020-06-09 DIAGNOSIS — S83512D Sprain of anterior cruciate ligament of left knee, subsequent encounter: Secondary | ICD-10-CM

## 2020-06-09 MED ORDER — OXYCODONE HCL 10 MG PO TABS: 10.0000 mg | ORAL_TABLET | Freq: Three times a day (TID) | ORAL | 0 refills | Status: DC | PRN

## 2020-06-11 ENCOUNTER — Encounter: Payer: Self-pay | Admitting: Orthopedic Surgery

## 2020-06-11 NOTE — Progress Notes (Signed)
Post-Op Visit Note   Patient: Elijah Johnson           Date of Birth: 11/19/1975           MRN: [id number] Visit Date: 06/09/2020 PCP: Blair Heys, MD   Assessment & Plan:  Chief Complaint:  Chief Complaint  Patient presents with  . Left Knee - Routine Post Op   Visit Diagnoses:  1. Rupture of anterior cruciate ligament of left knee, subsequent encounter     Plan: [name] is a 45 year old patient who is now about a week out left knee ACL reconstruction partial medial meniscectomy.  Quad autograft used combined with allograft for optimal size.  Taking pain medicine at night.  CPM machine at 66 degrees.  Aspiration performed Friday which gave him some relief.  On examination all incisions are intact.  Moderate effusion is present.  Portal sutures removed.  Graft is stable.  Flexion is to about 75 degrees.  He is able to do a straight leg raise.  Plan at this time is refill oxycodone.  Change over from Xarelto to 1 baby aspirin a day.  Negative Homans no calf tenderness at this time.  Start physical therapy.  2-week return.  Okay to transition from partial weightbearing to full weightbearing.  He needs to get a little bit more quad strength before he gets rid of the crutches.  Follow-Up Instructions: Return in about 2 weeks (around 06/23/2020).   Orders:  No orders of the defined types were placed in this encounter.  Meds ordered this encounter  Medications  . Oxycodone HCl 10 MG TABS    Sig: Take 1 tablet (10 mg total) by mouth every 8 (eight) hours as needed.    Dispense:  35 tablet    Refill:  0    Imaging: No results found.  PMFS History: Patient Active Problem List   Diagnosis Date Noted  . Clavicle fracture 03/07/2020  . Tear of right glenoid labrum 04/25/2013  . Plantar fasciitis of right foot 02/11/2013  . Rotator cuff (capsule) sprain 02/11/2013  . UNSPEC DISORDERS BURSAE&TENDONS SHOULDER REGION 05/26/2009  . STREPTOCOCCAL PHARYNGITIS 10/19/2008   Past  Medical History:  Diagnosis Date  . Chronic kidney disease    CKD 3 per patients wife  . Hypertension   . Labral tear of shoulder    right  . PONV (postoperative nausea and vomiting)   . Tear of left glenoid labrum 04/25/2013  . Tear of right glenoid labrum 04/25/2013    History reviewed. No pertinent family history.  Past Surgical History:  Procedure Laterality Date  . ANTERIOR CRUCIATE LIGAMENT REPAIR Left 06/01/2020   Procedure: left knee anterior crucitate ligament reconstruction, meniscal debridement;  Surgeon: Cammy Copa, MD;  Location: [facility];  Service: Orthopedics;  Laterality: Left;  . CHEST TUBE INSERTION Right 03/07/2020   Procedure: CHEST TUBE INSERTION;  Surgeon: Emelia Loron, MD;  Location: [facility] OR;  Service: General;  Laterality: Right;  . EYE SURGERY    . KNEE SURGERY Right   . left finger    . ORIF CLAVICULAR FRACTURE Right 03/07/2020   Procedure: OPEN REDUCTION INTERNAL FIXATION (ORIF) CLAVICULAR FRACTURE;  Surgeon: Roby Lofts, MD;  Location: [facility] OR;  Service: Orthopedics;  Laterality: Right;  . SCALP LACERATION REPAIR Right 03/07/2020   Procedure: SCALP LACERATION REPAIR;  Surgeon: Roby Lofts, MD;  Location: [facility] OR;  Service: Orthopedics;  Laterality: Right;  RIGHT POSTERIOR SCALP  . SHOULDER SURGERY Right 2014  dr Dion Saucier  . SHOULDER SURGERY Left   . VASECTOMY    . WISDOM TOOTH EXTRACTION     Social History   Occupational History  . Not on file  Tobacco Use  . Smoking status: Never Smoker  . Smokeless tobacco: Never Used  Vaping Use  . Vaping Use: Never used  Substance and Sexual Activity  . Alcohol use: Not Currently    Comment: social  . Drug use: No  . Sexual activity: Not on file

## 2020-06-18 ENCOUNTER — Other Ambulatory Visit (HOSPITAL_COMMUNITY)

## 2020-06-21 ENCOUNTER — Other Ambulatory Visit: Payer: Self-pay | Admitting: Surgical

## 2020-06-22 ENCOUNTER — Other Ambulatory Visit: Payer: Self-pay | Admitting: Surgical

## 2020-06-22 MED ORDER — OXYCODONE-ACETAMINOPHEN 5-325 MG PO TABS: 1.0000 | ORAL_TABLET | Freq: Three times a day (TID) | ORAL | 0 refills | Status: AC | PRN

## 2020-06-22 NOTE — Telephone Encounter (Signed)
Sent in refill

## 2020-06-24 ENCOUNTER — Telehealth: Payer: Self-pay

## 2020-06-24 NOTE — Telephone Encounter (Signed)
Patient called he has a appointment on Monday at 2:30 he is requesting to be worked into Dr.Deans schedule either after 3:30 or between 9:30-12:00 due to a conflicting appointment call back:[phone]

## 2020-06-25 NOTE — Telephone Encounter (Signed)
IC s/w patient. He will keep appt time already scheduled.  Advised him Dr August Saucer is in surgery on Monday morning.

## 2020-06-28 ENCOUNTER — Other Ambulatory Visit: Payer: Self-pay

## 2020-06-28 ENCOUNTER — Ambulatory Visit (INDEPENDENT_AMBULATORY_CARE_PROVIDER_SITE_OTHER): Admitting: Orthopedic Surgery

## 2020-06-28 DIAGNOSIS — S83512D Sprain of anterior cruciate ligament of left knee, subsequent encounter: Secondary | ICD-10-CM

## 2020-06-29 ENCOUNTER — Encounter: Payer: Self-pay | Admitting: Orthopedic Surgery

## 2020-06-29 NOTE — Progress Notes (Signed)
Post-Op Visit Note   Patient: Elijah Johnson           Date of Birth: 1975-11-03           MRN: [id number] Visit Date: 06/28/2020 PCP: Blair Heys, MD   Assessment & Plan:  Chief Complaint:  Chief Complaint  Patient presents with  . Left Knee - Routine Post Op   Visit Diagnoses:  1. Rupture of anterior cruciate ligament of left knee, subsequent encounter     Plan: Toryn is a 45 year old patient who is about a month out left knee ACL reconstruction and meniscal debridement.  Quad autograft.  With allograft to get a adequate size graft.  Oxycodone at night.  Therapy with Ellamae Sia.  He is off crutches.  Still having pain.  Working on his gait.  CPM at 110.  Finished that about 1/2 weeks ago.  States that he fatigues quickly.  On exam he has about 2 cm cardiac atrophy.  Is able to do a straight leg raise.  Antalgic gait to the left not unexpected.  ACL has 2 mm anterior drawer with good endpoint.  MCL also feel stable.  Flexion is to about 105.  Plan is to continue to work on rehab.  I think in general he is can have to be avoiding cutting and pivoting activity for the next 3 months just to let the graft revascularized.  4-week return for clinical recheck.  Follow-Up Instructions: Return in about 4 weeks (around 07/26/2020).   Orders:  No orders of the defined types were placed in this encounter.  No orders of the defined types were placed in this encounter.   Imaging: No results found.  PMFS History: Patient Active Problem List   Diagnosis Date Noted  . Clavicle fracture 03/07/2020  . Tear of right glenoid labrum 04/25/2013  . Plantar fasciitis of right foot 02/11/2013  . Rotator cuff (capsule) sprain 02/11/2013  . UNSPEC DISORDERS BURSAE&TENDONS SHOULDER REGION 05/26/2009  . STREPTOCOCCAL PHARYNGITIS 10/19/2008   Past Medical History:  Diagnosis Date  . Chronic kidney disease    CKD 3 per patients wife  . Hypertension   . Labral tear of shoulder    right   . PONV (postoperative nausea and vomiting)   . Tear of left glenoid labrum 04/25/2013  . Tear of right glenoid labrum 04/25/2013    History reviewed. No pertinent family history.  Past Surgical History:  Procedure Laterality Date  . ANTERIOR CRUCIATE LIGAMENT REPAIR Left 06/01/2020   Procedure: left knee anterior crucitate ligament reconstruction, meniscal debridement;  Surgeon: Cammy Copa, MD;  Location: [facility];  Service: Orthopedics;  Laterality: Left;  . CHEST TUBE INSERTION Right 03/07/2020   Procedure: CHEST TUBE INSERTION;  Surgeon: Emelia Loron, MD;  Location: [facility] OR;  Service: General;  Laterality: Right;  . EYE SURGERY    . KNEE SURGERY Right   . left finger    . ORIF CLAVICULAR FRACTURE Right 03/07/2020   Procedure: OPEN REDUCTION INTERNAL FIXATION (ORIF) CLAVICULAR FRACTURE;  Surgeon: Roby Lofts, MD;  Location: [facility] OR;  Service: Orthopedics;  Laterality: Right;  . SCALP LACERATION REPAIR Right 03/07/2020   Procedure: SCALP LACERATION REPAIR;  Surgeon: Roby Lofts, MD;  Location: [facility] OR;  Service: Orthopedics;  Laterality: Right;  RIGHT POSTERIOR SCALP  . SHOULDER SURGERY Right 2014   dr Dion Saucier  . SHOULDER SURGERY Left   . VASECTOMY    . WISDOM TOOTH EXTRACTION     Social  History   Occupational History  . Not on file  Tobacco Use  . Smoking status: Never Smoker  . Smokeless tobacco: Never Used  Vaping Use  . Vaping Use: Never used  Substance and Sexual Activity  . Alcohol use: Not Currently    Comment: social  . Drug use: No  . Sexual activity: Not on file

## 2020-06-30 ENCOUNTER — Inpatient Hospital Stay: Admitting: Orthopedic Surgery

## 2020-07-26 ENCOUNTER — Ambulatory Visit (INDEPENDENT_AMBULATORY_CARE_PROVIDER_SITE_OTHER): Admitting: Orthopedic Surgery

## 2020-07-26 ENCOUNTER — Other Ambulatory Visit: Payer: Self-pay

## 2020-07-26 ENCOUNTER — Encounter: Payer: Self-pay | Admitting: Orthopedic Surgery

## 2020-07-26 DIAGNOSIS — S83512D Sprain of anterior cruciate ligament of left knee, subsequent encounter: Secondary | ICD-10-CM
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
## 2020-09-22 NOTE — Telephone Encounter (Signed)
Hey can we get a letter typed up like this? I can make some modifications but it looks like a good template to start with from what he said

## 2021-08-23 ENCOUNTER — Other Ambulatory Visit (HOSPITAL_COMMUNITY): Payer: Self-pay

## 2021-11-14 ENCOUNTER — Other Ambulatory Visit (HOSPITAL_COMMUNITY): Payer: Self-pay

## 2021-11-14 MED ORDER — VALSARTAN 80 MG PO TABS
ORAL_TABLET | ORAL | 4 refills | Status: AC
Start: 2021-11-14 — End: ?
  Filled 2021-11-14: qty 90, 90d supply, fill #0

## 2021-11-22 ENCOUNTER — Other Ambulatory Visit (HOSPITAL_COMMUNITY): Payer: Self-pay

## 2022-04-06 ENCOUNTER — Other Ambulatory Visit (HOSPITAL_COMMUNITY): Payer: Self-pay

## 2022-04-06 MED ORDER — METHYLPHENIDATE 30 MG/9HR TD PTCH
1.0000 | MEDICATED_PATCH | Freq: Every morning | TRANSDERMAL | 0 refills | Status: AC
Start: 2022-04-06 — End: ?
  Filled 2022-04-06: qty 30, 30d supply, fill #0

## 2022-04-10 ENCOUNTER — Other Ambulatory Visit (HOSPITAL_COMMUNITY): Payer: Self-pay
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
## 2022-12-25 ENCOUNTER — Other Ambulatory Visit (HOSPITAL_COMMUNITY): Payer: Self-pay

## 2022-12-25 MED ORDER — HYDROCODONE-ACETAMINOPHEN 5-325 MG PO TABS
1.0000 | ORAL_TABLET | Freq: Four times a day (QID) | ORAL | 0 refills | Status: AC | PRN
  Filled 2022-12-25: qty 20, 3d supply, fill #0

## 2022-12-26 ENCOUNTER — Other Ambulatory Visit (HOSPITAL_COMMUNITY): Payer: Self-pay

## 2022-12-26 MED ORDER — ONDANSETRON HCL 4 MG PO TABS
4.0000 mg | ORAL_TABLET | Freq: Three times a day (TID) | ORAL | 0 refills | Status: AC | PRN
  Filled 2022-12-26: qty 10, 4d supply, fill #0

## 2022-12-26 MED ORDER — OXYCODONE HCL 5 MG PO TABS
5.0000 mg | ORAL_TABLET | Freq: Four times a day (QID) | ORAL | 0 refills | Status: AC | PRN
  Filled 2022-12-26: qty 15, 4d supply, fill #0

## 2022-12-27 ENCOUNTER — Other Ambulatory Visit (HOSPITAL_COMMUNITY): Payer: Self-pay

## 2023-06-01 ENCOUNTER — Other Ambulatory Visit (HOSPITAL_COMMUNITY): Payer: Self-pay

## 2023-06-01 ENCOUNTER — Other Ambulatory Visit: Payer: Self-pay

## 2023-06-01 MED ORDER — PROMETHAZINE HCL 25 MG PO TABS
25.0000 mg | ORAL_TABLET | Freq: Four times a day (QID) | ORAL | 0 refills | Status: AC
Start: 2023-06-01 — End: ?
  Filled 2023-06-01 (×2): qty 20, 5d supply, fill #0

## 2023-06-01 MED ORDER — OSELTAMIVIR PHOSPHATE 75 MG PO CAPS
75.0000 mg | ORAL_CAPSULE | Freq: Two times a day (BID) | ORAL | 0 refills | Status: AC
  Filled 2023-06-01 (×2): qty 10, 5d supply, fill #0

## 2024-01-31 ENCOUNTER — Other Ambulatory Visit (HOSPITAL_BASED_OUTPATIENT_CLINIC_OR_DEPARTMENT_OTHER): Payer: Self-pay | Admitting: Family Medicine

## 2024-01-31 DIAGNOSIS — E782 Mixed hyperlipidemia: Secondary | ICD-10-CM

## 2024-02-21 ENCOUNTER — Ambulatory Visit (HOSPITAL_BASED_OUTPATIENT_CLINIC_OR_DEPARTMENT_OTHER)
Admission: RE | Admit: 2024-02-21 | Discharge: 2024-02-21 | Disposition: A | Discharge status: Home / Self Care | Payer: Self-pay | Source: Ambulatory Visit | Attending: Family Medicine | Admitting: Family Medicine

## 2024-02-21 DIAGNOSIS — E782 Mixed hyperlipidemia: Secondary | ICD-10-CM | POA: Insufficient documentation

## 2024-05-26 ENCOUNTER — Other Ambulatory Visit (HOSPITAL_COMMUNITY): Payer: Self-pay

## 2024-05-26 MED ORDER — MELOXICAM 15 MG PO TABS
15.0000 mg | ORAL_TABLET | Freq: Every day | ORAL | 2 refills | Status: AC
  Filled 2024-05-26: qty 30, 30d supply, fill #0

## 2024-05-29 ENCOUNTER — Other Ambulatory Visit (HOSPITAL_COMMUNITY): Payer: Self-pay

## 2024-05-29 MED ORDER — OSELTAMIVIR PHOSPHATE 75 MG PO CAPS
75.0000 mg | ORAL_CAPSULE | Freq: Every day | ORAL | 0 refills | Status: AC
  Filled 2024-05-29: qty 10, 10d supply, fill #0
